# Patient Record
Sex: Female | Born: 1970 | Race: White | Hispanic: No | Marital: Married | Smoking: Former smoker
Health system: Southern US, Community
[De-identification: ages and names within clinical notes are randomized; demographics above are authoritative.]

## PROBLEM LIST (undated history)

## (undated) DIAGNOSIS — G43909 Migraine, unspecified, not intractable, without status migrainosus: Secondary | ICD-10-CM

## (undated) DIAGNOSIS — T7840XA Allergy, unspecified, initial encounter: Secondary | ICD-10-CM

## (undated) DIAGNOSIS — F339 Major depressive disorder, recurrent, unspecified: Secondary | ICD-10-CM

## (undated) DIAGNOSIS — R011 Cardiac murmur, unspecified: Secondary | ICD-10-CM

## (undated) DIAGNOSIS — F329 Major depressive disorder, single episode, unspecified: Secondary | ICD-10-CM

## (undated) DIAGNOSIS — F32A Depression, unspecified: Secondary | ICD-10-CM

## (undated) HISTORY — DX: Major depressive disorder, single episode, unspecified: F32.9

## (undated) HISTORY — DX: Allergy, unspecified, initial encounter: T78.40XA

## (undated) HISTORY — DX: Depression, unspecified: F32.A

## (undated) HISTORY — DX: Cardiac murmur, unspecified: R01.1

## (undated) HISTORY — DX: Migraine, unspecified, not intractable, without status migrainosus: G43.909

## (undated) HISTORY — DX: Major depressive disorder, recurrent, unspecified: F33.9

---

## 2018-06-02 ENCOUNTER — Telehealth: Payer: Self-pay

## 2018-06-02 NOTE — Telephone Encounter (Signed)
FYI  Copied from CRM [id number]. Topic: General - Other >> Jun 02, 2018 10:32 AM Raquel SarnaHayes, Teresa G wrote: New pt appt w/ Mardelle MatteAndy on 06-15-18.

## 2018-06-15 ENCOUNTER — Encounter: Payer: Self-pay | Admitting: Family Medicine

## 2018-06-15 ENCOUNTER — Other Ambulatory Visit: Payer: Self-pay

## 2018-06-15 ENCOUNTER — Ambulatory Visit (INDEPENDENT_AMBULATORY_CARE_PROVIDER_SITE_OTHER): Payer: BLUE CROSS/BLUE SHIELD | Admitting: Family Medicine

## 2018-06-15 VITALS — BP 110/64 | HR 53 | Temp 98.7°F | Ht 62.0 in | Wt 145.4 lb

## 2018-06-15 DIAGNOSIS — G43009 Migraine without aura, not intractable, without status migrainosus: Secondary | ICD-10-CM | POA: Insufficient documentation

## 2018-06-15 DIAGNOSIS — Z136 Encounter for screening for cardiovascular disorders: Secondary | ICD-10-CM | POA: Diagnosis not present

## 2018-06-15 DIAGNOSIS — G5603 Carpal tunnel syndrome, bilateral upper limbs: Secondary | ICD-10-CM

## 2018-06-15 DIAGNOSIS — M778 Other enthesopathies, not elsewhere classified: Secondary | ICD-10-CM

## 2018-06-15 DIAGNOSIS — Z8669 Personal history of other diseases of the nervous system and sense organs: Secondary | ICD-10-CM | POA: Diagnosis not present

## 2018-06-15 DIAGNOSIS — F339 Major depressive disorder, recurrent, unspecified: Secondary | ICD-10-CM | POA: Diagnosis not present

## 2018-06-15 DIAGNOSIS — M779 Enthesopathy, unspecified: Secondary | ICD-10-CM | POA: Diagnosis not present

## 2018-06-15 DIAGNOSIS — Z23 Encounter for immunization: Secondary | ICD-10-CM | POA: Diagnosis not present

## 2018-06-15 HISTORY — DX: Major depressive disorder, recurrent, unspecified: F33.9

## 2018-06-15 LAB — COMPREHENSIVE METABOLIC PANEL
ALT: 18 U/L (ref 0–35)
AST: 22 U/L (ref 0–37)
Albumin: 4.5 g/dL (ref 3.5–5.2)
Alkaline Phosphatase: 46 U/L (ref 39–117)
BUN: 19 mg/dL (ref 6–23)
CALCIUM: 9.3 mg/dL (ref 8.4–10.5)
CHLORIDE: 102 meq/L (ref 96–112)
CO2: 30 meq/L (ref 19–32)
Creatinine, Ser: 0.74 mg/dL (ref 0.40–1.20)
GFR: 90.24 mL/min (ref >60.00)
GLUCOSE: 100 mg/dL — AB (ref 70–99)
POTASSIUM: 3.9 meq/L (ref 3.5–5.1)
Sodium: 139 mEq/L (ref 135–145)
Total Bilirubin: 0.4 mg/dL (ref 0.2–1.2)
Total Protein: 6.8 g/dL (ref 6.0–8.3)

## 2018-06-15 LAB — CBC WITH DIFFERENTIAL/PLATELET
Basophils Absolute: 0 10*3/uL (ref 0.0–0.1)
Basophils Relative: 0.7 % (ref 0.0–3.0)
EOS PCT: 2.5 % (ref 0.0–5.0)
Eosinophils Absolute: 0.1 10*3/uL (ref 0.0–0.7)
HCT: 40 % (ref 36.0–46.0)
Hemoglobin: 13.8 g/dL (ref 12.0–15.0)
LYMPHS ABS: 2 10*3/uL (ref 0.7–4.0)
Lymphocytes Relative: 36.5 % (ref 12.0–46.0)
MCHC: 34.4 g/dL (ref 30.0–36.0)
MCV: 93 fl (ref 78.0–100.0)
MONO ABS: 0.3 10*3/uL (ref 0.1–1.0)
MONOS PCT: 6 % (ref 3.0–12.0)
NEUTROS ABS: 2.9 10*3/uL (ref 1.4–7.7)
NEUTROS PCT: 54.3 % (ref 43.0–77.0)
PLATELETS: 228 10*3/uL (ref 150.0–400.0)
RBC: 4.3 Mil/uL (ref 3.87–5.11)
RDW: 12.7 % (ref 11.5–15.5)
WBC: 5.4 10*3/uL (ref 4.0–10.5)

## 2018-06-15 LAB — LIPID PANEL
CHOL/HDL RATIO: 3
Cholesterol: 173 mg/dL (ref 0–200)
HDL: 60.1 mg/dL (ref >39.00)
LDL Cholesterol: 90 mg/dL (ref 0–99)
NonHDL: 112.54
TRIGLYCERIDES: 114 mg/dL (ref 0.0–149.0)
VLDL: 22.8 mg/dL (ref 0.0–40.0)

## 2018-06-15 LAB — TSH: TSH: 1.38 u[IU]/mL (ref 0.35–4.50)

## 2018-06-15 MED ORDER — DICLOFENAC SODIUM 75 MG PO TBEC: 75.0000 mg | DELAYED_RELEASE_TABLET | Freq: Two times a day (BID) | ORAL | 0 refills | Status: DC

## 2018-06-15 NOTE — Patient Instructions (Signed)
Please return in 2-4 weeks if your hand pain symptoms persist.  Start wearing carpal tunnel splints as much as possible.  Take the diclofenac twice a day for 7-14 days with meals.   It was a pleasure meeting you today! Thank you for choosing [place] to meet your healthcare needs! I truly look forward to working with you. If you have any questions or concerns, please send me a message via Mychart or call the office at [phone].   Carpal Tunnel Syndrome Carpal tunnel syndrome is a condition that causes pain in your hand and arm. The carpal tunnel is a narrow area located on the palm side of your wrist. Repeated wrist motion or certain diseases may cause swelling within the tunnel. This swelling pinches the main nerve in the wrist (median nerve). What are the causes? This condition may be caused by:  Repeated wrist motions.  Wrist injuries.  Arthritis.  A cyst or tumor in the carpal tunnel.  Fluid buildup during pregnancy.  Sometimes the cause of this condition is not known. What increases the risk? This condition is more likely to develop in:  People who have jobs that cause them to repeatedly move their wrists in the same motion, such as Health visitor.  Women.  People with certain conditions, such as: ? Diabetes. ? Obesity. ? An underactive thyroid (hypothyroidism). ? Kidney failure.  What are the signs or symptoms? Symptoms of this condition include:  A tingling feeling in your fingers, especially in your thumb, index, and middle fingers.  Tingling or numbness in your hand.  An aching feeling in your entire arm, especially when your wrist and elbow are bent for long periods of time.  Wrist pain that goes up your arm to your shoulder.  Pain that goes down into your palm or fingers.  A weak feeling in your hands. You may have trouble grabbing and holding items.  Your symptoms may feel worse during the night. How is this diagnosed? This condition is diagnosed  with a medical history and physical exam. You may also have tests, including:  An electromyogram (EMG). This test measures electrical signals sent by your nerves into the muscles.  X-rays.  How is this treated? Treatment for this condition includes:  Lifestyle changes. It is important to stop doing or modify the activity that caused your condition.  Physical or occupational therapy.  Medicines for pain and inflammation. This may include medicine that is injected into your wrist.  A wrist splint.  Surgery.  Follow these instructions at home: If you have a splint:  Wear it as told by your health care provider. Remove it only as told by your health care provider.  Loosen the splint if your fingers become numb and tingle, or if they turn cold and blue.  Keep the splint clean and dry. General instructions  Take over-the-counter and prescription medicines only as told by your health care provider.  Rest your wrist from any activity that may be causing your pain. If your condition is work related, talk to your employer about changes that can be made, such as getting a wrist pad to use while typing.  If directed, apply ice to the painful area: ? Put ice in a plastic bag. ? Place a towel between your skin and the bag. ? Leave the ice on for 20 minutes, 2-3 times per day.  Keep all follow-up visits as told by your health care provider. This is important.  Do any exercises as told by  your health care provider, physical therapist, or occupational therapist. Contact a health care provider if:  You have new symptoms.  Your pain is not controlled with medicines.  Your symptoms get worse. This information is not intended to replace advice given to you by your health care provider. Make sure you discuss any questions you have with your health care provider. Document Released: 09/04/2000 Document Revised: 01/16/2016 Document Reviewed: 01/23/2015 Elsevier Interactive Patient Education   [employer].

## 2018-06-15 NOTE — Progress Notes (Signed)
Subjective  CC:  Chief Complaint  Patient presents with  . Establish Care    Transfer from [place], [place], Last seen at [facility], last physical 01/2018, requests flu shot   . Hand Pain    Bi-lateral Hand Pain, hands fall asleep periodically     HPI: Tiffany Knox is a 47 y.o. female who presents to [facility] at [facility] today to establish care with me as a new patient.  Very pleasant 34 year old married mother of 2 children who recently moved here from [place] due to her husband's job.  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  Living healthy lifestyle with healthy diet and frequent exercise.  Recent physicals are up-to-date.  Mammogram and Pap smears were normal this year.  Believes her tetanus shot is up-to-date as well.  Needle records to verify. She has the following concerns or needs:  Reports 2-year history of carpal tunnel symptoms that are not progressive.  Reports daily pain, paresthesias at night.  Also with left greater than right dorsal hand pain.  Hurts to grip.  Admits has been packing and moving and recently painting her new home.  No trauma.  She exercises frequently.  Uses heavy weights.  No elbow pain.  Distant history of rheumatoid arthritis in grandparent.  No red hot swollen joints.  She has not taken any medications for pain.  She did have significant carpal tunnel treated with splints during her pregnancy 8 years ago.  Past medical history significant for childhood migraines, chronic recurrent depression is well controlled on Lexapro  Assessment  1. Bilateral carpal tunnel syndrome   2. History of migraine   3. Major depression, recurrent, chronic (HCC)   4. Hand tendonitis   5. Screening for cardiovascular condition      Plan   Bilateral carpal tunnel: Education treatment management strategies discussed.  See AVS.  Start diclofenac and wrist splints.  Return if not improved  Hand tendinitis plus minus mild  osteoarthritis: NSAIDs as needed.  Check thyroid and other labs for carpal tunnel and cardiovascular screening test.  Continue treatment for major depression.  Flu shot updated today.  Health maintenance visits due in May.  Follow up:  Return in about 8 months (around 02/13/2019) for complete physical. Orders Placed This Encounter  Procedures  . Flu Vaccine QUAD 6+ mos PF IM (Fluarix Quad PF)  . CBC with Differential/Platelet  . TSH  . Comprehensive metabolic panel  . Lipid panel   Meds ordered this encounter  Medications  . diclofenac (VOLTAREN) 75 MG EC tablet    Sig: Take 1 tablet (75 mg total) by mouth 2 (two) times daily.    Dispense:  30 tablet    Refill:  0     No flowsheet data found.  We updated and reviewed the patient's past history in detail and it is documented below.  Patient Active Problem List   Diagnosis Date Noted  . History of migraine 06/15/2018  . 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 Well controlled.    Health Maintenance  Topic Date Due  . HIV Screening  07/22/1988  . TETANUS/TDAP  07/22/1992  . INFLUENZA VACCINE  04/21/2018  . MAMMOGRAM  01/20/2020  . PAP SMEAR  11/19/2020   There is no immunization history for the selected administration types on file for this patient. Current Meds  Medication Sig  .  escitalopram (LEXAPRO) 20 MG tablet Take 20 mg by mouth daily.    Allergies: Patient has No Known Allergies. Past Medical History Patient  has a past medical history of Allergy, Depression, Heart murmur, Major depression, recurrent, chronic (HCC) (06/15/2018), and Migraines. Past Surgical History Patient  has no past surgical history on file. Family History: Patient family history includes Bladder Cancer in her mother; Breast cancer in her paternal grandmother; Healthy in her daughter. Social History:  Patient  reports that  she quit smoking about 15 years ago. She has a 15.00 pack-year smoking history. She has never used smokeless tobacco. She reports that she drinks alcohol. She reports that she does not use drugs.  Review of Systems: Constitutional: negative for fever or malaise Ophthalmic: negative for photophobia, double vision or loss of vision Cardiovascular: negative for chest pain, dyspnea on exertion, or new LE swelling Respiratory: negative for SOB or persistent cough Gastrointestinal: negative for abdominal pain, change in bowel habits or melena Genitourinary: negative for dysuria or gross hematuria Musculoskeletal: negative for new gait disturbance or muscular weakness Integumentary: negative for new or persistent rashes Neurological: negative for TIA or stroke symptoms Psychiatric: negative for SI or delusions Allergic/Immunologic: negative for hives  Patient Care Team    Relationship Specialty Notifications Start End  Willow Ora, MD PCP - General Family Medicine  06/15/18     Objective  Vitals: BP 110/64   Pulse (!) 53   Temp 98.7 F (37.1 C)   Ht 5\' 2"  (1.575 m)   Wt 145 lb 6.4 oz (66 kg)   SpO2 98%   BMI 26.59 kg/m  General:  Well developed, well nourished, no acute distress, felt appearing Psych:  Alert and oriented,normal mood and affect HEENT:  Normocephalic, atraumatic, non-icteric sclera, PERRL, oropharynx is without mass or exudate, supple neck without adenopathy, mass or thyromegaly Cardiovascular:  RRR without gallop, rub or murmur, nondisplaced PMI Respiratory:  Good breath sounds bilaterally, CTAB with normal respiratory effort  MSK: no deformities, contusions. Joints are without erythema or swelling, nontender joints, dorsal hand tendons mildly tender without redness or warmth.  Normal strength throughout.  Positive Phalen's bilaterally, no weakness or atrophy Skin:  Warm, no rashes or suspicious lesions noted Neurologic:    Mental status is normal. Gross motor and  sensory exams are normal. Normal gait   Commons side effects, risks, benefits, and alternatives for medications and treatment plan prescribed today were discussed, and the patient expressed understanding of the given instructions. Patient is instructed to call or message via MyChart if he/she has any questions or concerns regarding our treatment plan. No barriers to understanding were identified. We discussed Red Flag symptoms and signs in detail. Patient expressed understanding regarding what to do in case of urgent or emergency type symptoms.   Medication list was reconciled, printed and provided to the patient in AVS. Patient instructions and summary information was reviewed with the patient as documented in the AVS. This note was prepared with assistance of Dragon voice recognition software. Occasional wrong-word or sound-a-like substitutions may have occurred due to the inherent limitations of voice recognition software

## 2018-06-16 ENCOUNTER — Encounter: Payer: Self-pay | Admitting: Family Medicine

## 2018-06-16 NOTE — Progress Notes (Signed)
I have reviewed results. Normal. Patient notified by letter. Please see letter for details. 

## 2018-06-16 NOTE — Progress Notes (Signed)
Lab results mailed to patient in letter. Normal results. No action / follow up needed on these results.  

## 2018-08-29 ENCOUNTER — Encounter: Payer: Self-pay | Admitting: Family Medicine

## 2018-08-29 ENCOUNTER — Ambulatory Visit (INDEPENDENT_AMBULATORY_CARE_PROVIDER_SITE_OTHER): Payer: BLUE CROSS/BLUE SHIELD | Admitting: Family Medicine

## 2018-08-29 ENCOUNTER — Other Ambulatory Visit: Payer: Self-pay

## 2018-08-29 VITALS — BP 110/62 | HR 72 | Temp 98.0°F | Ht 62.0 in | Wt 145.0 lb

## 2018-08-29 DIAGNOSIS — R21 Rash and other nonspecific skin eruption: Secondary | ICD-10-CM

## 2018-08-29 MED ORDER — BETAMETHASONE DIPROPIONATE 0.05 % EX CREA: TOPICAL_CREAM | Freq: Two times a day (BID) | CUTANEOUS | 0 refills | Status: DC

## 2018-08-29 NOTE — Progress Notes (Signed)
Subjective  CC:  Chief Complaint  Patient presents with  . Rash    rash on stomach and arms x 1 week, red and itchy     HPI: Tiffany Knox is a 47 y.o. female who presents to the office today to address the problems listed above in the chief complaint.  47 year old healthy female here with rash as noted above.  Started on lower abdomen.  Describes red itching rash with small bumps now on left wrist and bilateral axilla.  She reports that her son has a red macular rash on the dorsum of his hands.  Her daughter has a papular rash.  She denies recent travel.  She does have URI but this started just a few days ago.  She has not used any topical medications.  No history of nickel dermatitis or allergic dermatitis.  No associated symptoms. Assessment  1. Rash and nonspecific skin eruption      Plan   Rash: Contact dermatitis versus allergic dermatitis versus scabies versus others.  Unclear.  Rash was atypical.  Recommend steroid cream twice daily and follow-up if spreading.  Consider treatment for scabies.  No allergens identified  Follow up: Return in about 5 months (around 01/28/2019) for complete physical.   No orders of the defined types were placed in this encounter.  Meds ordered this encounter  Medications  . betamethasone dipropionate (DIPROLENE) 0.05 % cream    Sig: Apply topically 2 (two) times daily.    Dispense:  45 g    Refill:  0      I reviewed the patients updated PMH, FH, and SocHx.    Patient Active Problem List   Diagnosis Date Noted  . History of migraine 06/15/2018  . Major depression, recurrent, chronic (HCC) 06/15/2018   Current Meds  Medication Sig  . escitalopram (LEXAPRO) 20 MG tablet Take 20 mg by mouth daily.    Allergies: Patient has No Known Allergies. Family History: Patient family history includes Arthritis in her paternal grandmother; Asthma in her paternal grandmother, sister, and sister; Bladder Cancer in her mother; Breast cancer in her  paternal grandmother; COPD in her father; Early death in her paternal grandfather; Healthy in her daughter; Hearing loss in her father; Heart attack in her paternal grandfather; Hyperlipidemia in her maternal grandmother; Hypertension in her father and maternal grandmother; Kidney disease in her maternal grandfather; Lung cancer in her maternal grandfather; Miscarriages / [place] in her mother; Stroke in her mother. Social History:  Patient  reports that she quit smoking about 15 years ago. She has a 15.00 pack-year smoking history. She has never used smokeless tobacco. She reports that she drinks alcohol. She reports that she does not use drugs.  Review of Systems: Constitutional: Negative for fever malaise or anorexia Cardiovascular: negative for chest pain Respiratory: negative for SOB or persistent cough Gastrointestinal: negative for abdominal pain  Objective  Vitals: BP 110/62   Pulse 72   Temp 98 F (36.7 C)   Ht 5\' 2"  (1.575 m)   Wt 145 lb (65.8 kg)   SpO2 98%   BMI 26.52 kg/m  General: no acute distress , A&Ox3 HEENT: PEERL, conjunctiva normal, nasal congestion present oropharynx moist,neck is supple Skin:  Warm, lower abdomen with irregularly-shaped macular rash with satellite lesions and excoriation with bruising, fine papular rash on bilateral axilla, question burrow at left volar wrist.  No urticaria, no vesicles, no flaking.  No annular lesions.    Commons side effects, risks, benefits, and alternatives for  medications and treatment plan prescribed today were discussed, and the patient expressed understanding of the given instructions. Patient is instructed to call or message via MyChart if he/she has any questions or concerns regarding our treatment plan. No barriers to understanding were identified. We discussed Red Flag symptoms and signs in detail. Patient expressed understanding regarding what to do in case of urgent or emergency type symptoms.   Medication list was  reconciled, printed and provided to the patient in AVS. Patient instructions and summary information was reviewed with the patient as documented in the AVS. This note was prepared with assistance of Dragon voice recognition software. Occasional wrong-word or sound-a-like substitutions may have occurred due to the inherent limitations of voice recognition software

## 2018-08-29 NOTE — Patient Instructions (Addendum)
Please return in May 2020 for your annual complete physical; please come fasting.  Use the cream twice daily x 1-2 weeks.  Start zyrtec nightly for itching; you may add benadryl or benadryl cream as well.  Let me know if it is spreading.   If you have any questions or concerns, please don't hesitate to send me a message via MyChart or call the office at [phone]. Thank you for visiting with Tiffany Knox today! It's our pleasure caring for you.

## 2018-11-16 ENCOUNTER — Ambulatory Visit (INDEPENDENT_AMBULATORY_CARE_PROVIDER_SITE_OTHER): Payer: BLUE CROSS/BLUE SHIELD | Admitting: Family Medicine

## 2018-11-16 ENCOUNTER — Other Ambulatory Visit: Payer: Self-pay

## 2018-11-16 ENCOUNTER — Encounter: Payer: Self-pay | Admitting: Family Medicine

## 2018-11-16 VITALS — BP 98/58 | HR 54 | Temp 99.4°F | Resp 14 | Ht 62.0 in | Wt 135.8 lb

## 2018-11-16 DIAGNOSIS — F339 Major depressive disorder, recurrent, unspecified: Secondary | ICD-10-CM | POA: Diagnosis not present

## 2018-11-16 MED ORDER — ESCITALOPRAM OXALATE 20 MG PO TABS: 20.0000 mg | ORAL_TABLET | Freq: Every day | ORAL | 3 refills | Status: DC

## 2018-11-16 NOTE — Progress Notes (Signed)
Subjective  CC:  Chief Complaint  Patient presents with  . Depression    Medication refill    HPI: Tiffany Knox is a 48 y.o. female who presents to the office today to address the problems listed above in the chief complaint. Tiffany Knox 48 year old female, very healthy and active with chronic depression that has been managed on Lexapro 20 mg for the last year or so.  Prior to this she was on sertraline.  Her prior PCP initiated change due to lessening efficacy of Zoloft.  Since, she reports she is doing very well.  She denies apathy or side effects.  Her mood is well controlled.  No symptoms of depression or anxiety at this time.   Assessment  1. Major depression, recurrent, chronic (HCC)      Plan   Depression: This medical condition is well controlled. There are no signs of complications, medication side effects, or red flags. Patient is instructed to continue the current treatment plan without change in therapies or medications.  Refill medication  Follow up: Return in about 3 months (around 02/14/2019) for complete physical.  Visit date not found  No orders of the defined types were placed in this encounter.  Meds ordered this encounter  Medications  . escitalopram (LEXAPRO) 20 MG tablet    Sig: Take 1 tablet (20 mg total) by mouth daily.    Dispense:  90 tablet    Refill:  3      I reviewed the patients updated PMH, FH, and SocHx.    Patient Active Problem List   Diagnosis Date Noted  . History of migraine 06/15/2018  . Major depression, recurrent, chronic (HCC) 06/15/2018   Current Meds  Medication Sig  . betamethasone dipropionate (DIPROLENE) 0.05 % cream Apply topically 2 (two) times daily.  [name] escitalopram (LEXAPRO) 20 MG tablet Take 1 tablet (20 mg total) by mouth daily.  . [DISCONTINUED] escitalopram (LEXAPRO) 20 MG tablet Take 20 mg by mouth daily.    Allergies: Patient has No Known Allergies. Family History: Patient family history includes Arthritis in her  paternal grandmother; Asthma in her paternal grandmother, sister, and sister; Bladder Cancer in her mother; Breast cancer in her paternal grandmother; COPD in her father; Early death in her paternal grandfather; Healthy in her daughter; Hearing loss in her father; Heart attack in her paternal grandfather; Hyperlipidemia in her maternal grandmother; Hypertension in her father and maternal grandmother; Kidney disease in her maternal grandfather; Lung cancer in her maternal grandfather; Miscarriages / [place] in her mother; Stroke in her mother. Social History:  Patient  reports that she quit smoking about 16 years ago. She has a 15.00 pack-year smoking history. She has never used smokeless tobacco. She reports current alcohol use. She reports that she does not use drugs.  Review of Systems: Constitutional: Negative for fever malaise or anorexia Cardiovascular: negative for chest pain Respiratory: negative for SOB or persistent cough Gastrointestinal: negative for abdominal pain  Objective  Vitals: BP (!) 98/58   Pulse (!) 54   Temp 99.4 F (37.4 C) (Oral)   Resp 14   Ht 5\' 2"  (1.575 m)   Wt 135 lb 12.8 oz (61.6 kg)   LMP 10/26/2018   SpO2 98%   BMI 24.84 kg/m  General: no acute distress , A&Ox3 Psych: Appears well.    Commons side effects, risks, benefits, and alternatives for medications and treatment plan prescribed today were discussed, and the patient expressed understanding of the given instructions. Patient is instructed to  call or message via MyChart if he/she has any questions or concerns regarding our treatment plan. No barriers to understanding were identified. We discussed Red Flag symptoms and signs in detail. Patient expressed understanding regarding what to do in case of urgent or emergency type symptoms.   Medication list was reconciled, printed and provided to the patient in AVS. Patient instructions and summary information was reviewed with the patient as documented in  the AVS. This note was prepared with assistance of Dragon voice recognition software. Occasional wrong-word or sound-a-like substitutions may have occurred due to the inherent limitations of voice recognition software

## 2018-11-16 NOTE — Patient Instructions (Signed)
Please return in May 2020 for your annual complete physical; please come fasting.   If you have any questions or concerns, please don't hesitate to send me a message via MyChart or call the office at [phone]. Thank you for visiting with [place] today! It's our pleasure caring for you.

## 2019-02-09 ENCOUNTER — Ambulatory Visit (INDEPENDENT_AMBULATORY_CARE_PROVIDER_SITE_OTHER): Payer: BLUE CROSS/BLUE SHIELD | Admitting: Family Medicine

## 2019-02-09 ENCOUNTER — Encounter: Payer: Self-pay | Admitting: Family Medicine

## 2019-02-09 ENCOUNTER — Other Ambulatory Visit: Payer: Self-pay

## 2019-02-09 VITALS — BP 98/62 | HR 52 | Temp 97.8°F | Resp 16 | Ht 62.0 in | Wt 132.2 lb

## 2019-02-09 DIAGNOSIS — Z0000 Encounter for general adult medical examination without abnormal findings: Secondary | ICD-10-CM

## 2019-02-09 DIAGNOSIS — Z1239 Encounter for other screening for malignant neoplasm of breast: Secondary | ICD-10-CM | POA: Diagnosis not present

## 2019-02-09 DIAGNOSIS — F339 Major depressive disorder, recurrent, unspecified: Secondary | ICD-10-CM

## 2019-02-09 LAB — CBC WITH DIFFERENTIAL/PLATELET
Basophils Absolute: 0 10*3/uL (ref 0.0–0.1)
Basophils Relative: 0.8 % (ref 0.0–3.0)
Eosinophils Absolute: 0.1 10*3/uL (ref 0.0–0.7)
Eosinophils Relative: 2.8 % (ref 0.0–5.0)
HCT: 39.2 % (ref 36.0–46.0)
Hemoglobin: 13.7 g/dL (ref 12.0–15.0)
Lymphocytes Relative: 32.3 % (ref 12.0–46.0)
Lymphs Abs: 1.7 10*3/uL (ref 0.7–4.0)
MCHC: 34.9 g/dL (ref 30.0–36.0)
MCV: 92.6 fl (ref 78.0–100.0)
Monocytes Absolute: 0.4 10*3/uL (ref 0.1–1.0)
Monocytes Relative: 7 % (ref 3.0–12.0)
Neutro Abs: 3 10*3/uL (ref 1.4–7.7)
Neutrophils Relative %: 57.1 % (ref 43.0–77.0)
Platelets: 221 10*3/uL (ref 150.0–400.0)
RBC: 4.23 Mil/uL (ref 3.87–5.11)
RDW: 13.1 % (ref 11.5–15.5)
WBC: 5.3 10*3/uL (ref 4.0–10.5)

## 2019-02-09 LAB — LIPID PANEL
Cholesterol: 177 mg/dL (ref 0–200)
HDL: 64.9 mg/dL (ref >39.00)
LDL Cholesterol: 100 mg/dL — ABNORMAL HIGH (ref 0–99)
NonHDL: 112.44
Total CHOL/HDL Ratio: 3
Triglycerides: 64 mg/dL (ref 0.0–149.0)
VLDL: 12.8 mg/dL (ref 0.0–40.0)

## 2019-02-09 LAB — COMPREHENSIVE METABOLIC PANEL
ALT: 21 U/L (ref 0–35)
AST: 25 U/L (ref 0–37)
Albumin: 4.4 g/dL (ref 3.5–5.2)
Alkaline Phosphatase: 34 U/L — ABNORMAL LOW (ref 39–117)
BUN: 23 mg/dL (ref 6–23)
CO2: 31 mEq/L (ref 19–32)
Calcium: 8.9 mg/dL (ref 8.4–10.5)
Chloride: 104 mEq/L (ref 96–112)
Creatinine, Ser: 0.88 mg/dL (ref 0.40–1.20)
GFR: 69.32 mL/min (ref >60.00)
Glucose, Bld: 80 mg/dL (ref 70–99)
Potassium: 3.6 mEq/L (ref 3.5–5.1)
Sodium: 142 mEq/L (ref 135–145)
Total Bilirubin: 0.5 mg/dL (ref 0.2–1.2)
Total Protein: 6.7 g/dL (ref 6.0–8.3)

## 2019-02-09 NOTE — Progress Notes (Signed)
Subjective  Chief Complaint  Patient presents with  . Annual Exam    She is fasting    HPI: Tiffany Knox is a 48 y.o. female who presents to [facility] [place] today for a Female Wellness Visit.   Wellness Visit: annual visit with health maintenance review and exam without Pap   Happy and healthy. No concerns. Mood is well controlled. Continues on meds. Healthy lifestyle. 48 yo and 46 yo at home and doing well. No birth control needs due to husband with vasectomy. Exercises regularly. Due mammogram. Pap is up to date.   Assessment  1. Annual physical exam   2. Major depression, recurrent, chronic (HCC)   3. Breast cancer screening      Plan  Female Wellness Visit:  Age appropriate Health Maintenance and Prevention measures were discussed with patient. Included topics are cancer screening recommendations, ways to keep healthy (see AVS) including dietary and exercise recommendations, regular eye and dental care, use of seat belts, and avoidance of moderate alcohol use and tobacco use. Referred for mammogram.   BMI: discussed patient's BMI and encouraged positive lifestyle modifications to help get to or maintain a target BMI.  HM needs and immunizations were addressed and ordered. See below for orders. See HM and immunization section for updates.  Routine labs and screening tests ordered including cmp, cbc and lipids where appropriate.  Discussed recommendations regarding Vit D and calcium supplementation (see AVS)  Continue antidepressant.   Follow up: Return in about 1 year (around 02/09/2020) for complete physical.   Orders Placed This Encounter  Procedures  . MM DIGITAL SCREENING BILATERAL  . CBC with Differential/Platelet  . Comprehensive metabolic panel  . Lipid panel  . HIV Antibody (routine testing w rflx)   No orders of the defined types were placed in this encounter.    Lifestyle: Body mass index is 24.18 kg/m. Wt Readings from Last 3  Encounters:  02/09/19 132 lb 3.2 oz (60 kg)  11/16/18 135 lb 12.8 oz (61.6 kg)  08/29/18 145 lb (65.8 kg)   Diet: low fat Exercise: frequently,   Patient Active Problem List   Diagnosis Date Noted  . History of migraine 06/15/2018  . 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 Well controlled.    Health Maintenance  Topic Date Due  . HIV Screening  07/22/1988  . MAMMOGRAM  01/20/2019  . INFLUENZA VACCINE  04/22/2019  . TETANUS/TDAP  07/26/2019  . PAP SMEAR-Modifier  11/19/2020   Immunization History  Administered Date(s) Administered  . Hepatitis A 12/30/2015, 07/02/2016  . Influenza,inj,Quad PF,6+ Mos 06/15/2018  . Td 08/05/2004  . Tdap 07/25/2009   We updated and reviewed the patient's past history in detail and it is documented below. Allergies: Patient has No Known Allergies. Past Medical History Patient  has a past medical history of Allergy, Depression, Heart murmur, Major depression, recurrent, chronic (HCC) (06/15/2018), and Migraines. Past Surgical History Patient  has no past surgical history on file. Family History: Patient family history includes Arthritis in her paternal grandmother; Asthma in her paternal grandmother, sister, and sister; Bladder Cancer in her mother; Breast cancer in her paternal grandmother; COPD in her father; Early death in her paternal grandfather; Healthy in her daughter; Hearing loss in her father; Heart attack in her paternal grandfather; Hyperlipidemia in her maternal grandmother; Hypertension in her father and maternal grandmother; Kidney  disease in her maternal grandfather; Lung cancer in her maternal grandfather; Miscarriages / [place] in her mother; Stroke in her mother. Social History:  Patient  reports that she quit smoking about 16 years ago. She has a 15.00 pack-year smoking history. She has never used smokeless  tobacco. She reports current alcohol use. She reports that she does not use drugs.  Review of Systems: Constitutional: negative for fever or malaise Ophthalmic: negative for photophobia, double vision or loss of vision Cardiovascular: negative for chest pain, dyspnea on exertion, or new LE swelling Respiratory: negative for SOB or persistent cough Gastrointestinal: negative for abdominal pain, change in bowel habits or melena Genitourinary: negative for dysuria or gross hematuria, no abnormal uterine bleeding or disharge Musculoskeletal: negative for new gait disturbance or muscular weakness Integumentary: negative for new or persistent rashes, no breast lumps Neurological: negative for TIA or stroke symptoms Psychiatric: negative for SI or delusions Allergic/Immunologic: negative for hives  Patient Care Team    Relationship Specialty Notifications Start End  Willow OraAndy,  L, MD PCP - General Family Medicine  06/15/18     Objective  Vitals: BP 98/62   Pulse (!) 52   Temp 97.8 F (36.6 C) (Oral)   Resp 16   Ht 5\' 2"  (1.575 m)   Wt 132 lb 3.2 oz (60 kg)   LMP 02/01/2019   SpO2 98%   BMI 24.18 kg/m  General:  Well developed, well nourished, no acute distress  Psych:  Alert and orientedx3,normal mood and affect HEENT:  Normocephalic, atraumatic, non-icteric sclera, PERRL, oropharynx is clear without mass or exudate, supple neck without adenopathy, mass or thyromegaly Cardiovascular:  Normal S1, S2, RRR without gallop, rub or murmur, nondisplaced PMI Respiratory:  Good breath sounds bilaterally, CTAB with normal respiratory effort Gastrointestinal: normal bowel sounds, soft, non-tender, no noted masses. No HSM MSK: no deformities, contusions. Joints are without erythema or swelling. Spine and CVA region are nontender Skin:  Warm, no rashes or suspicious lesions noted Neurologic:    Mental status is normal. CN 2-11 are normal. Gross motor and sensory exams are normal. Normal gait. No  tremor Breast Exam: No mass, skin retraction or nipple discharge is appreciated in either breast. No axillary adenopathy. Fibrocystic changes are not noted   Commons side effects, risks, benefits, and alternatives for medications and treatment plan prescribed today were discussed, and the patient expressed understanding of the given instructions. Patient is instructed to call or message via MyChart if he/she has any questions or concerns regarding our treatment plan. No barriers to understanding were identified. We discussed Red Flag symptoms and signs in detail. Patient expressed understanding regarding what to do in case of urgent or emergency type symptoms.   Medication list was reconciled, printed and provided to the patient in AVS. Patient instructions and summary information was reviewed with the patient as documented in the AVS. This note was prepared with assistance of Dragon voice recognition software. Occasional wrong-word or sound-a-like substitutions may have occurred due to the inherent limitations of voice recognition software

## 2019-02-09 NOTE — Patient Instructions (Addendum)
Please return in 12 months for your annual complete physical; please come fasting.  Please sign up for Mychart. A test with a link was sent to your phone.  I will release your lab results to you on your MyChart account with further instructions. Please reply with any questions.   If you have any questions or concerns, please don't hesitate to send me a message via MyChart or call the office at [phone]. Thank you for visiting with [place] today! It's our pleasure caring for you.  Please do these things to maintain good health!   Exercise at least 30-45 minutes a day,  4-5 days a week.   Eat a low-fat diet with lots of fruits and vegetables, up to 7-9 servings per day.  Drink plenty of water daily. Try to drink 8 8oz glasses per day.  Seatbelts can save your life. Always wear your seatbelt.  Place Smoke Detectors on every level of your home and check batteries every year.  Schedule an appointment with an eye doctor for an eye exam every 1-2 years  Safe sex - use condoms to protect yourself from STDs if you could be exposed to these types of infections. Use birth control if you do not want to become pregnant and are sexually active.  Avoid heavy alcohol use. If you drink, keep it to less than 2 drinks/day and not every day.  Health Care Power of Attorney.  Choose someone you trust that could speak for you if you became unable to speak for yourself.  Depression is common in our stressful world.If you're feeling down or losing interest in things you normally enjoy, please come in for a visit.  If anyone is threatening or hurting you, please get help. Physical or Emotional Violence is never OK.

## 2019-02-10 ENCOUNTER — Encounter: Payer: Self-pay | Admitting: Family Medicine

## 2019-02-10 LAB — HIV ANTIBODY (ROUTINE TESTING W REFLEX): HIV 1&2 Ab, 4th Generation: NONREACTIVE

## 2019-02-10 NOTE — Progress Notes (Signed)
Lab results mailed to patient in letter. Normal results. No action / follow up needed on these results.  

## 2019-08-24 ENCOUNTER — Other Ambulatory Visit: Payer: Self-pay

## 2019-08-24 DIAGNOSIS — Z20822 Contact with and (suspected) exposure to covid-19: Secondary | ICD-10-CM

## 2019-08-26 LAB — NOVEL CORONAVIRUS, NAA: SARS-CoV-2, NAA: NOT DETECTED

## 2019-11-24 ENCOUNTER — Other Ambulatory Visit: Payer: Self-pay | Admitting: Family Medicine

## 2019-12-14 ENCOUNTER — Ambulatory Visit: Payer: Self-pay | Attending: Family

## 2019-12-14 DIAGNOSIS — Z23 Encounter for immunization: Secondary | ICD-10-CM

## 2019-12-14 NOTE — Progress Notes (Signed)
   Covid-19 Vaccination Clinic  Name:  Tiffany Knox    MRN: [id number] DOB: [birth year]  12/14/2019  Tiffany Knox was observed post Covid-19 immunization for 15 minutes without incident. She was provided with Vaccine Information Sheet and instruction to access the V-Safe system.   Tiffany Knox was instructed to call 911 with any severe reactions post vaccine: [name] Difficulty breathing  . Swelling of face and throat  . A fast heartbeat  . A bad rash all over body  . Dizziness and weakness   Immunizations Administered    Name Date Dose VIS Date Route   Moderna COVID-19 Vaccine 12/14/2019 11:39 AM 0.5 mL 08/22/2019 Intramuscular   Manufacturer: Moderna   Lot: [id number]   NDC: 82505-397-67

## 2020-01-16 ENCOUNTER — Ambulatory Visit: Payer: Self-pay | Attending: Family

## 2020-01-16 DIAGNOSIS — Z23 Encounter for immunization: Secondary | ICD-10-CM

## 2020-01-16 NOTE — Progress Notes (Signed)
   Covid-19 Vaccination Clinic  Name:  Tiffany Knox    MRN: [id number] DOB: 06/30/1971  01/16/2020  [name] was observed post Covid-19 immunization for 15 minutes without incident. She was provided with Vaccine Information Sheet and instruction to access the V-Safe system.   [name] was instructed to call 911 with any severe reactions post vaccine: [name] Difficulty breathing  . Swelling of face and throat  . A fast heartbeat  . A bad rash all over body  . Dizziness and weakness   Immunizations Administered    Name Date Dose VIS Date Route   Moderna COVID-19 Vaccine 01/16/2020 10:30 AM 0.5 mL 08/2019 Intramuscular   Manufacturer: Moderna   Lot: [id number]   NDC: 92763-943-20

## 2020-02-15 ENCOUNTER — Encounter: Payer: BC Managed Care – PPO | Admitting: Family Medicine

## 2020-02-16 ENCOUNTER — Encounter: Payer: Self-pay | Admitting: Family Medicine

## 2020-02-16 ENCOUNTER — Ambulatory Visit (INDEPENDENT_AMBULATORY_CARE_PROVIDER_SITE_OTHER): Payer: BC Managed Care – PPO | Admitting: Family Medicine

## 2020-02-16 ENCOUNTER — Other Ambulatory Visit: Payer: Self-pay

## 2020-02-16 VITALS — BP 118/74 | HR 61 | Temp 97.7°F | Resp 18 | Ht 62.0 in | Wt 136.6 lb

## 2020-02-16 DIAGNOSIS — F339 Major depressive disorder, recurrent, unspecified: Secondary | ICD-10-CM | POA: Diagnosis not present

## 2020-02-16 DIAGNOSIS — Z0000 Encounter for general adult medical examination without abnormal findings: Secondary | ICD-10-CM | POA: Diagnosis not present

## 2020-02-16 DIAGNOSIS — M5116 Intervertebral disc disorders with radiculopathy, lumbar region: Secondary | ICD-10-CM | POA: Diagnosis not present

## 2020-02-16 MED ORDER — TRAMADOL HCL 50 MG PO TABS: 50.0000 mg | ORAL_TABLET | Freq: Three times a day (TID) | ORAL | 0 refills | Status: AC | PRN

## 2020-02-16 MED ORDER — ESCITALOPRAM OXALATE 20 MG PO TABS: 10.0000 mg | ORAL_TABLET | Freq: Every day | ORAL | Status: DC

## 2020-02-16 MED ORDER — PREDNISONE 10 MG PO TABS
ORAL_TABLET | ORAL | 0 refills | Status: DC
Start: 2020-02-16 — End: 2021-06-23

## 2020-02-16 NOTE — Progress Notes (Signed)
Subjective  Chief Complaint  Patient presents with  . Back Pain    Back pain that has been radiating down her right leg into her foot. The back pain came from working out.   . Annual Exam    Will schedule a mammogram when shes done moving. She would like to get her tdap at her next office visit.     HPI: Tiffany Knox is a 49 y.o. female who presents to [place] at [place] today for a Female Wellness Visit. She also has the concerns and/or needs as listed above in the chief complaint. These will be addressed in addition to the Health Maintenance Visit.   Wellness Visit: annual visit with health maintenance review and exam without Pap   HM: overdue mammogram. Pap up to date. She is moving to [facility] next week. Will get established there and have mammo done. Declines tdap today. Chronic disease f/u and/or acute problem visit: (deemed necessary to be done in addition to the wellness visit):  Was exercising 2 weeks ago: crossFit and was twisting to the right with dumbbells in hands: felt acute sharp pain that radiated down right leg. Since has had daily pain with radiation down leg with some numbness. Taking 800 advil and tylenol w/ minimal relief. However, is sleep well. Ok lying down. Hasn't been able to exercise since the acute injury. Also is packing up house for move across country next week. No B/B dysfunction. No leg weakness. No h/o back problems.   Depression: now doing well on lexapro 10 daily. Has been well controlled for several years.  Assessment  1. Annual physical exam   2. Lumbar disc herniation with radiculopathy   3. Major depression, recurrent, chronic [facility])      Plan  Female Wellness Visit:  Age appropriate Health Maintenance and Prevention measures were discussed with patient. Included topics are cancer screening recommendations, ways to keep healthy (see AVS) including dietary and exercise recommendations, regular eye and dental care, use of  seat belts, and avoidance of moderate alcohol use and tobacco use. rec mammogram in [place] soon.   BMI: discussed patient's BMI and encouraged positive lifestyle modifications to help get to or maintain a target BMI.  HM needs and immunizations were addressed and ordered. See below for orders. See HM and immunization section for updates. tdap when able  Routine labs and screening tests ordered including cmp, cbc and lipids where appropriate.  Discussed recommendations regarding Vit D and calcium supplementation (see AVS)  Chronic disease management visit and/or acute problem visit:  Depression is well controlled.   Lumbar herniated disk with radiculopathy: mild to moderate. Start pred and tramadol. Stretches. Avoid further strain/heavy lifting. Discussed red flag sxs. F/u with me next week if not improving.   Follow up: Return if symptoms worsen or fail to improve.  Orders Placed This Encounter  Procedures  . TSH  . Lipid panel  . Comprehensive metabolic panel  . CBC with Differential/Platelet   Meds ordered this encounter  Medications  . predniSONE (DELTASONE) 10 MG tablet    Sig: Take 4 tabs qd x 2 days, 3 qd x 2 days, 2 qd x 2d, 1qd x 3 days    Dispense:  21 tablet    Refill:  0  . traMADol (ULTRAM) 50 MG tablet    Sig: Take 1 tablet (50 mg total) by mouth every 8 (eight) hours as needed for up to 5 days.    Dispense:  15 tablet  Refill:  0  . escitalopram (LEXAPRO) 20 MG tablet    Sig: Take 0.5 tablets (10 mg total) by mouth daily.      Lifestyle: Body mass index is 24.98 kg/m. Wt Readings from Last 3 Encounters:  02/16/20 136 lb 9.6 oz (62 kg)  02/09/19 132 lb 3.2 oz (60 kg)  11/16/18 135 lb 12.8 oz (61.6 kg)    Patient Active Problem List   Diagnosis Date Noted  . History of migraine 06/15/2018  . 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 Well  controlled.    Health Maintenance  Topic Date Due  . TETANUS/TDAP  02/15/2021 (Originally 07/26/2019)  . INFLUENZA VACCINE  04/21/2020  . PAP SMEAR-Modifier  11/19/2020  . COVID-19 Vaccine  Completed  . HIV Screening  Completed   Immunization History  Administered Date(s) Administered  . Hepatitis A 12/30/2015, 07/02/2016  . Influenza,inj,Quad PF,6+ Mos 06/15/2018  . Moderna SARS-COVID-2 Vaccination 12/14/2019, 01/16/2020  . Td 08/05/2004  . Tdap 07/25/2009   We updated and reviewed the patient's past history in detail and it is documented below. Allergies: Patient has No Known Allergies. Past Medical History Patient  has a past medical history of Allergy, Depression, Heart murmur, Major depression, recurrent, chronic (HCC) (06/15/2018), and Migraines. Past Surgical History Patient  has no past surgical history on file. Family History: Patient family history includes Arthritis in her paternal grandmother; Asthma in her paternal grandmother, sister, and sister; Bladder Cancer in her mother; Breast cancer in her paternal grandmother; COPD in her father; Early death in her paternal grandfather; Healthy in her daughter; Hearing loss in her father; Heart attack in her paternal grandfather; Hyperlipidemia in her maternal grandmother; Hypertension in her father and maternal grandmother; Kidney disease in her maternal grandfather; Lung cancer in her maternal grandfather; Miscarriages / [place] in her mother; Stroke in her mother. Social History:  Patient  reports that she quit smoking about 17 years ago. She has a 15.00 pack-year smoking history. She has never used smokeless tobacco. She reports current alcohol use. She reports that she does not use drugs.  Review of Systems: Constitutional: negative for fever or malaise Ophthalmic: negative for photophobia, double vision or loss of vision Cardiovascular: negative for chest pain, dyspnea on exertion, or new LE swelling Respiratory:  negative for SOB or persistent cough Gastrointestinal: negative for abdominal pain, change in bowel habits or melena Genitourinary: negative for dysuria or gross hematuria, no abnormal uterine bleeding or disharge Musculoskeletal: +for new gait disturbance or muscular weakness Integumentary: negative for new or persistent rashes, no breast lumps Neurological: negative for TIA or stroke symptoms Psychiatric: negative for SI or delusions Allergic/Immunologic: negative for hives  Patient Care Team    Relationship Specialty Notifications Start End  Willow Ora, MD PCP - General Family Medicine  06/15/18     Objective  Vitals: BP 118/74   Pulse 61   Temp 97.7 F (36.5 C) (Temporal)   Resp 18   Ht 5\' 2"  (1.575 m)   Wt 136 lb 9.6 oz (62 kg)   SpO2 98%   BMI 24.98 kg/m  General:  Well developed, well nourished, no acute distress  Psych:  Alert and orientedx3,normal mood and affect HEENT:  Normocephalic, atraumatic, non-icteric sclera,  supple neck without adenopathy, mass or thyromegaly Cardiovascular:  Normal S1, S2, RRR without gallop, rub or murmur Respiratory:  Good breath sounds bilaterally, CTAB  with normal respiratory effort Gastrointestinal: normal bowel sounds, soft, non-tender, no noted masses. No HSM MSK: no deformities, contusions. Joints are without erythema or swelling.  Skin:  Warm, no rashes or suspicious lesions noted Neurologic:    Mental status is normal. CN 2-11 are normal. Gross motor and sensory exams are normal. Normal gait. No tremor Breast Exam: No mass, skin retraction or nipple discharge is appreciated in either breast. No axillary adenopathy. Fibrocystic changes are not noted Back: ttp right lower lumbar area, FROM, neg seated SLR bilaterally with mild positive SLR on right lying; nl strength BLE. No knee reflex on right, +2 on left. + 2 ankle relfexes bilaterally, nl gait.    Commons side effects, risks, benefits, and alternatives for medications and  treatment plan prescribed today were discussed, and the patient expressed understanding of the given instructions. Patient is instructed to call or message via MyChart if he/she has any questions or concerns regarding our treatment plan. No barriers to understanding were identified. We discussed Red Flag symptoms and signs in detail. Patient expressed understanding regarding what to do in case of urgent or emergency type symptoms.   Medication list was reconciled, printed and provided to the patient in AVS. Patient instructions and summary information was reviewed with the patient as documented in the AVS. This note was prepared with assistance of Dragon voice recognition software. Occasional wrong-word or sound-a-like substitutions may have occurred due to the inherent limitations of voice recognition software  This visit occurred during the SARS-CoV-2 public health emergency.  Safety protocols were in place, including screening questions prior to the visit, additional usage of staff PPE, and extensive cleaning of exam room while observing appropriate contact time as indicated for disinfecting solutions.

## 2020-02-16 NOTE — Patient Instructions (Signed)
You are due a mammogram when you can.  Your next physical will be due in May 2021 in [place]. Call me next week if needed and best of luck!   Herniated Disk  A herniated disk, also called a ruptured disk or slipped disk, occurs when a disk in the spine bulges out too far. Between the bones in the spine (vertebrae), there are oval disks that are made of a soft, spongy center filled with liquid that is surrounded by a tough outer ring. The disks connect the vertebrae, help the spine move, and keep the bones from rubbing against each other when you move. When you have a herniated disk, the spongy center of the disk bulges out or breaks through the outer ring. The spongy center can press on a nerve between the vertebrae and cause pain. This can occur anywhere in the back or neck area, but the lower back is most commonly affected. What are the causes? This condition may be caused by:  Age-related wear and tear. The spongy centers of spinal disks tend to shrink and dry out with age, which makes them more likely to herniate.  Sudden injury, such as a strain or sprain. What increases the risk? The following factors may make you more likely to develop this condition:  Aging. This is the main risk factor for a herniated disk.  Being a man who is 67-58 years old.  Frequently doing activities that involve heavy lifting, bending, or twisting.  Not getting enough exercise.  Being overweight.  Using tobacco products. What are the signs or symptoms? Symptoms may vary depending on where your herniated disk is located.  A herniated disk in the lower back may cause sharp pain in: ? Part of the arm, leg, hip, or buttocks. ? The back of the lower leg (calf). ? The lower back, spreading down through the leg into the foot (sciatica).  A herniated disk in the neck may cause dizziness and vertigo. It may also cause pain or weakness in: ? The neck. ? The shoulder blades. ? The upper arm, forearm, or  fingers. You may also have muscle weakness. It may be difficult to:  Lift your leg or arm.  Stand on your toes.  Squeeze tightly with one of your hands. Other symptoms may include:  Numbness or tingling in the affected areas of the hands, arms, feet, or legs.  Inability to control when you urinate or when you have bowel movements. This is a rare but serious sign of a severe herniated disk in the lower back. How is this diagnosed? This condition may be diagnosed based on:  Your symptoms.  Your medical history.  A physical exam. The exam may include: ? A straight-leg test. For this test, you will lie on your back while your health care provider lifts your leg, keeping your knee straight. If you feel pain, you likely have a herniated disk. ? Neurologic tests. These include checking for numbness, reflexes, muscle strength, and problems with posture.  Imaging tests, such as: ? X-rays. ? MRI. ? CT scan. ? Electromyogram (EMG) to check the nerves that control muscles. This test may be used to determine which nerves are affected by your herniated disk. How is this treated? Treatment for this condition may include:  A short period of rest. This is usually the first treatment. ? You may be on bed rest for up to 2 days, or you may be instructed to stay home and avoid physical activity. ? If you  have a herniated disk in your lower back, avoid sitting as much as possible. Sitting increases pressure on the disk.  Medicines. These may include: ? NSAIDs, such as ibuprofen, to help reduce pain and swelling. ? Muscle relaxants to prevent sudden tightening of the back muscles (back spasms). ? Prescription pain medicines, if you have severe pain.  Ice or heat therapy.  Steroid injections in the area of the herniated disk. These can help reduce pain and swelling.  Physical therapy to strengthen your back muscles. In many cases, symptoms go away with treatment over a period of days or weeks.  You will most likely be free of symptoms after 3-4 months. If other treatments do not help to relieve your symptoms, you may need surgery. Follow these instructions at home: Medicines  Take over-the-counter and prescription medicines only as told by your health care provider.  Ask your health care provider if the medicine prescribed to you: ? Requires you to avoid driving or using heavy machinery. ? Can cause constipation. You may need to take these actions to prevent or treat constipation:  Drink enough fluid to keep your urine pale yellow.  Take over-the-counter or prescription medicines.  Eat foods that are high in fiber, such as beans, whole grains, and fresh fruits and vegetables.  Limit foods that are high in fat and processed sugars, such as fried or sweet foods. Managing pain, stiffness, and swelling      If directed, put ice on the painful area. Icing can help to relieve pain. To do this: ? Put ice in a plastic bag. ? Place a towel between your skin and the bag. ? Leave the ice on for 20 minutes, 2-3 times a day.  If directed, apply heat to the painful area as often as told by your health care provider. Heat can reduce the stiffness of your muscles. Use the heat source that your health care provider recommends, such as a moist heat pack or a heating pad. ? Place a towel between your skin and the heat source. ? Leave the heat on for 20-30 minutes. ? Remove the heat if your skin turns bright red. This is especially important if you are unable to feel pain, heat, or cold. You may have a greater risk of getting burned. Activity  Rest as told by your health care provider.  After your rest period: ? Return to your normal activities and gradually begin exercising as told by your health care provider. Ask your health care provider what activities and exercises are safe for you. ? Use good posture. ? Avoid movements that cause pain. ? Do not lift anything that is heavier than  10 lb (4.5 kg), or the limit that you are told, until your health care provider says that it is safe. ? Do not sit or stand for long periods of time without changing positions. ? Do not sit for long periods of time without getting up and moving around.  If physical therapy was prescribed, do exercises as told by your health care provider.  Aim to strengthen muscles in your back and abdomen with exercises such as swimming or walking. General instructions  Do not use any products that contain nicotine or tobacco, such as cigarettes, e-cigarettes, and chewing tobacco. These products can delay healing. If you need help quitting, ask your health care provider.  Do not wear high-heeled shoes.  Do not sleep on your abdomen.  If you are overweight, work with your health care provider to lose  weight safely.  Keep all follow-up visits as told by your health care provider. This is important. How is this prevented?   Maintain a healthy weight.  Maintain physical fitness. Do at least 150 minutes of moderate-intensity exercise each week, such as brisk walking or water aerobics.  When lifting objects: ? Keep your feet at least shoulder-width apart and tighten the muscles of your abdomen. ? Keep your spine neutral as you bend your knees and hips. It is important to lift using the strength of your legs, not your back. Do not lock your knees straight out. ? Always ask for help to lift heavy or awkward objects. Contact a health care provider if you:  Have back pain or neck pain that does not get better after 6 weeks.  Have severe pain in your back, neck, legs, or arms.  Develop numbness, tingling, or weakness anywhere in your body. Get help right away if:  You cannot move your arms or legs.  You cannot control when you urinate or have bowel movements.  You feel dizzy or you faint.  You have shortness of breath. Summary  A herniated disk, also called a ruptured disk or slipped disk, occurs  when a disk in the spine bulges out too far (herniates).  This condition may be caused by age-related wear and tear or a sudden injury.  Symptoms may vary depending on where your herniated disk is located.  Treatment may include rest, medicines, ice or heat therapy, steroid injections, and physical therapy.  If other treatments do not help to relieve your symptoms, you may need surgery. This information is not intended to replace advice given to you by your health care provider. Make sure you discuss any questions you have with your health care provider. Document Revised: 04/11/2019 Document Reviewed: 04/11/2019 Elsevier Patient Education  2020 [employer].

## 2020-02-17 LAB — LIPID PANEL
Cholesterol: 132 mg/dL (ref <200)
HDL: 55 mg/dL (ref >=50)
LDL Cholesterol (Calc): 63 mg/dL (calc)
Non-HDL Cholesterol (Calc): 77 mg/dL (calc) (ref <130)
Total CHOL/HDL Ratio: 2.4 (calc) (ref <5.0)
Triglycerides: 56 mg/dL (ref <150)

## 2020-02-17 LAB — TSH: TSH: 1.49 mIU/L

## 2020-02-17 LAB — COMPREHENSIVE METABOLIC PANEL
AG Ratio: 2 (calc) (ref 1.0–2.5)
ALT: 24 U/L (ref 6–29)
AST: 31 U/L (ref 10–35)
Albumin: 4.5 g/dL (ref 3.6–5.1)
Alkaline phosphatase (APISO): 35 U/L (ref 31–125)
BUN: 9 mg/dL (ref 7–25)
CO2: 27 mmol/L (ref 20–32)
Calcium: 9.3 mg/dL (ref 8.6–10.2)
Chloride: 96 mmol/L — ABNORMAL LOW (ref 98–110)
Creat: 0.85 mg/dL (ref 0.50–1.10)
Globulin: 2.2 g/dL (calc) (ref 1.9–3.7)
Glucose, Bld: 101 mg/dL — ABNORMAL HIGH (ref 65–99)
Potassium: 3.7 mmol/L (ref 3.5–5.3)
Sodium: 132 mmol/L — ABNORMAL LOW (ref 135–146)
Total Bilirubin: 0.8 mg/dL (ref 0.2–1.2)
Total Protein: 6.7 g/dL (ref 6.1–8.1)

## 2020-02-17 LAB — CBC WITH DIFFERENTIAL/PLATELET
Absolute Monocytes: 475 cells/uL (ref 200–950)
Basophils Absolute: 73 cells/uL (ref 0–200)
Basophils Relative: 1 %
Eosinophils Absolute: 110 cells/uL (ref 15–500)
Eosinophils Relative: 1.5 %
HCT: 39.3 % (ref 35.0–45.0)
Hemoglobin: 13.4 g/dL (ref 11.7–15.5)
Lymphs Abs: 2460 cells/uL (ref 850–3900)
MCH: 31.5 pg (ref 27.0–33.0)
MCHC: 34.1 g/dL (ref 32.0–36.0)
MCV: 92.3 fL (ref 80.0–100.0)
MPV: 10.1 fL (ref 7.5–12.5)
Monocytes Relative: 6.5 %
Neutro Abs: 4183 cells/uL (ref 1500–7800)
Neutrophils Relative %: 57.3 %
Platelets: 209 10*3/uL (ref 140–400)
RBC: 4.26 10*6/uL (ref 3.80–5.10)
RDW: 11.9 % (ref 11.0–15.0)
Total Lymphocyte: 33.7 %
WBC: 7.3 10*3/uL (ref 3.8–10.8)

## 2020-02-20 ENCOUNTER — Encounter: Payer: Self-pay | Admitting: Family Medicine

## 2020-04-19 ENCOUNTER — Other Ambulatory Visit: Payer: Self-pay

## 2020-04-19 ENCOUNTER — Telehealth: Payer: Self-pay | Admitting: Family Medicine

## 2020-04-19 MED ORDER — ESCITALOPRAM OXALATE 20 MG PO TABS: 10.0000 mg | ORAL_TABLET | Freq: Every day | ORAL | 1 refills | Status: DC

## 2020-04-19 NOTE — Telephone Encounter (Signed)
Refill sent.

## 2020-04-19 NOTE — Telephone Encounter (Signed)
..   LAST APPOINTMENT DATE: 02/16/2020   NEXT APPOINTMENT DATE:@Visit  date not found  MEDICATION:escitalopram (LEXAPRO) 20 MG tablet    PHARMACY: [facility] [address] [facility] [place] [address]  **Let patient know to contact pharmacy at the end of the day to make sure medication is ready. **  ** Please notify patient to allow 48-72 hours to process**  **Encourage patient to contact the pharmacy for refills or they can request refills through [facility]**  CLINICAL FILLS OUT ALL BELOW:   LAST REFILL:  QTY:  REFILL DATE:    OTHER COMMENTS:   Okay for refill?  Please advise

## 2021-04-29 ENCOUNTER — Ambulatory Visit: Payer: BC Managed Care – PPO | Admitting: Family Medicine

## 2021-05-19 ENCOUNTER — Ambulatory Visit: Payer: BC Managed Care – PPO | Admitting: Family Medicine

## 2021-06-23 ENCOUNTER — Other Ambulatory Visit (HOSPITAL_COMMUNITY)
Admission: RE | Admit: 2021-06-23 | Discharge: 2021-06-23 | Disposition: A | Discharge status: Home / Self Care | Payer: BC Managed Care – PPO | Source: Ambulatory Visit | Attending: Family Medicine | Admitting: Family Medicine

## 2021-06-23 ENCOUNTER — Encounter: Payer: Self-pay | Admitting: Family Medicine

## 2021-06-23 ENCOUNTER — Other Ambulatory Visit: Payer: Self-pay

## 2021-06-23 ENCOUNTER — Ambulatory Visit: Payer: BC Managed Care – PPO | Admitting: Family Medicine

## 2021-06-23 VITALS — BP 120/80 | HR 54 | Temp 98.1°F | Ht 62.0 in | Wt 145.8 lb

## 2021-06-23 DIAGNOSIS — Z0000 Encounter for general adult medical examination without abnormal findings: Secondary | ICD-10-CM

## 2021-06-23 DIAGNOSIS — Z1211 Encounter for screening for malignant neoplasm of colon: Secondary | ICD-10-CM

## 2021-06-23 DIAGNOSIS — M47816 Spondylosis without myelopathy or radiculopathy, lumbar region: Secondary | ICD-10-CM | POA: Insufficient documentation

## 2021-06-23 DIAGNOSIS — Z23 Encounter for immunization: Secondary | ICD-10-CM

## 2021-06-23 DIAGNOSIS — F339 Major depressive disorder, recurrent, unspecified: Secondary | ICD-10-CM | POA: Diagnosis not present

## 2021-06-23 DIAGNOSIS — Z1212 Encounter for screening for malignant neoplasm of rectum: Secondary | ICD-10-CM

## 2021-06-23 DIAGNOSIS — Z124 Encounter for screening for malignant neoplasm of cervix: Secondary | ICD-10-CM | POA: Insufficient documentation

## 2021-06-23 DIAGNOSIS — Z1231 Encounter for screening mammogram for malignant neoplasm of breast: Secondary | ICD-10-CM

## 2021-06-23 NOTE — Progress Notes (Signed)
Subjective  Chief Complaint  Patient presents with   Annual Exam   Depression   Migraine    HPI: Tiffany Knox is a 50 y.o. female who presents to [facility] [place] [facility] today for a Female Wellness Visit. She also has the concerns and/or needs as listed above in the chief complaint. These will be addressed in addition to the Health Maintenance Visit.   Wellness Visit: annual visit with health maintenance review and exam with Pap  HM: due pap and crc screen. Tdap and flu. Healthy lifestyle. Moved to [place] and then returned back here in July.  Chronic disease f/u and/or acute problem visit: (deemed necessary to be done in addition to the wellness visit): Chronic depression; no longer on lexapro: doing well. Feels better off meds. No low mood. Handling stressors of moving back fairly well but it has been a long year  Reports low back pain: had work up and PT in [place]: reports MRI showed herniated disc and "fractures". Also had stress tib fracture from running.   Assessment  1. Annual physical exam   2. Major depression, recurrent, chronic (HCC)   3. Cervical cancer screening   4. Screening for colorectal cancer   5. Encounter for screening mammogram for malignant neoplasm of breast   6. Spondylosis of lumbar region without myelopathy or radiculopathy      Plan  Female Wellness Visit: Age appropriate Health Maintenance and Prevention measures were discussed with patient. Included topics are cancer screening recommendations, ways to keep healthy (see AVS) including dietary and exercise recommendations, regular eye and dental care, use of seat belts, and avoidance of moderate alcohol use and tobacco use. Pt to consider CRC. Defers for now. Mammo ordered. Pap and hr hpv screen done today.  BMI: discussed patient's BMI and encouraged positive lifestyle modifications to help get to or maintain a target BMI. HM needs and immunizations were addressed and ordered. See below for  orders. See HM and immunization section for updates. Flu and tdap today. Routine labs and screening tests ordered including cmp, cbc and lipids where appropriate. Discussed recommendations regarding Vit D and calcium supplementation (see AVS)  Chronic disease management visit and/or acute problem visit: Depression: resolved, remission. Monitor.  Low back pain: to request records.   Follow up: 12 mo for cpe  Orders Placed This Encounter  Procedures   MM DIGITAL SCREENING BILATERAL   CBC with Differential/Platelet   Comprehensive metabolic panel   Hepatitis C antibody   Lipid panel   TSH   No orders of the defined types were placed in this encounter.     Body mass index is 26.67 kg/m. 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 Well controlled.    Health Maintenance  Topic Date Due   Hepatitis C Screening  Never done   COLONOSCOPY (Pts 45-43yrs Insurance coverage will need to be confirmed)  Never done   TETANUS/TDAP  07/26/2019   INFLUENZA VACCINE  04/21/2021   PAP SMEAR-Modifier  11/20/2022   COVID-19 Vaccine  Completed   HIV Screening  Completed   HPV VACCINES  Aged Out   Immunization History  Administered Date(s) Administered   Hepatitis A 12/30/2015, 07/02/2016   Influenza,inj,Quad PF,6+ Mos 06/15/2018   Moderna SARS-COV2 Booster Vaccination 09/10/2020   Moderna Sars-Covid-2 Vaccination 12/14/2019, 01/16/2020   Td 08/05/2004   Tdap 07/25/2009   We updated and reviewed the patient's past history in detail and it is documented  below. Allergies: Patient has No Known Allergies. Past Medical History Patient  has a past medical history of Allergy, Depression, Heart murmur, Major depression, recurrent, chronic (HCC) (06/15/2018), and Migraines. Past Surgical History Patient  has no past surgical history on file. Family History: Patient family history includes Arthritis in her paternal grandmother; Asthma in her paternal grandmother, sister, and sister; Bladder Cancer in her mother; Breast cancer in her paternal grandmother; COPD in her father; Early death in her paternal grandfather; Healthy in her daughter; Hearing loss in her father; Heart attack in her paternal grandfather; Hyperlipidemia in her maternal grandmother; Hypertension in her father and maternal grandmother; Kidney disease in her maternal grandfather; Lung cancer in her maternal grandfather; Miscarriages / [place] in her mother; Stroke in her mother. Social History:  Patient  reports that she quit smoking about 18 years ago. Her smoking use included cigarettes. She has a 15.00 pack-year smoking history. She has never used smokeless tobacco. She reports current alcohol use. She reports that she does not use drugs.  Review of Systems: Constitutional: negative for fever or malaise Ophthalmic: negative for photophobia, double vision or loss of vision Cardiovascular: negative for chest pain, dyspnea on exertion, or new LE swelling Respiratory: negative for SOB or persistent cough Gastrointestinal: negative for abdominal pain, change in bowel habits or melena Genitourinary: negative for dysuria or gross hematuria, no abnormal uterine bleeding or disharge Musculoskeletal: negative for new gait disturbance or muscular weakness Integumentary: negative for new or persistent rashes, no breast lumps Neurological: negative for TIA or stroke symptoms Psychiatric: negative for SI or delusions Allergic/Immunologic: negative for hives  Patient Care Team     Relationship Specialty Notifications Start End  Willow Ora, MD PCP - General Family Medicine  04/22/21     Objective  Vitals: BP 120/80   Pulse (!) 54   Temp 98.1 F (36.7 C) (Temporal)   Ht 5\' 2"  (1.575 m)   Wt 145 lb 12.8 oz (66.1 kg)   LMP 06/01/2021 (Exact Date)   SpO2 100%   BMI 26.67 kg/m  General:  Well developed, well nourished, no acute distress  Psych:  Alert and orientedx3,normal mood and affect HEENT:  Normocephalic, atraumatic, non-icteric sclera,  supple neck without adenopathy, mass or thyromegaly Cardiovascular:  Normal S1, S2, RRR without gallop, rub or murmur Respiratory:  Good breath sounds bilaterally, CTAB with normal respiratory effort Gastrointestinal: normal bowel sounds, soft, non-tender, no noted masses. No HSM MSK: no deformities, contusions. Joints are without erythema or swelling.  Skin:  Warm, no rashes or suspicious lesions noted Neurologic:    Mental status is normal. CN 2-11 are normal. Gross motor and sensory exams are normal. Normal gait. No tremor Breast Exam: No mass, skin retraction or nipple discharge is appreciated in either breast. No axillary adenopathy. Fibrocystic changes are not noted Pelvic Exam: Normal external genitalia, no vulvar or vaginal lesions present. Clear cervix w/o CMT. Bimanual exam reveals a nontender fundus w/o masses, nl size. No adnexal masses present. No inguinal adenopathy. A PAP smear was performed.   Commons side effects, risks,  benefits, and alternatives for medications and treatment plan prescribed today were discussed, and the patient expressed understanding of the given instructions. Patient is instructed to call or message via MyChart if he/she has any questions or concerns regarding our treatment plan. No barriers to understanding were identified. We discussed Red Flag symptoms and signs in detail. Patient expressed understanding regarding what to do in case of urgent or emergency type symptoms.  Medication list  was reconciled, printed and provided to the patient in AVS. Patient instructions and summary information was reviewed with the patient as documented in the AVS. This note was prepared with assistance of Dragon voice recognition software. Occasional wrong-word or sound-a-like substitutions may have occurred due to the inherent limitations of voice recognition software  This visit occurred during the SARS-CoV-2 public health emergency.  Safety protocols were in place, including screening questions prior to the visit, additional usage of staff PPE, and extensive cleaning of exam room while observing appropriate contact time as indicated for disinfecting solutions.

## 2021-06-23 NOTE — Patient Instructions (Addendum)
Please return in 12 months for your annual complete physical; please come fasting.   I will release your lab results to you on your MyChart account with further instructions. Please reply with any questions.    Today you were given your Flu and Tdap vaccinations.    Consider getting a colonoscopy or performing the Cologuard test for colon cancer screening. Send me a message if you'd like me to order for your.   I have ordered a mammogram and/or bone density for you as we discussed today: [x]   Mammogram  []   Bone Density  Please call the office checked below to schedule your appointment:  [x]   [facility]      [address] [place],        [address] East Wing         []   [facility]  [address] [place],  [facility]   If you have any questions or concerns, please don't hesitate to send me a message via MyChart or call the office at [phone]. Thank you for visiting with [facility] today! It's our pleasure caring for you.   Please do these things to maintain good health!  Exercise at least 30-45 minutes a day,  4-5 days a week.  Eat a low-fat diet with lots of fruits and vegetables, up to 7-9 servings per day. Drink plenty of water daily. Try to drink 8 8oz glasses per day. Seatbelts can save your life. Always wear your seatbelt. Place Smoke Detectors on every level of your home and check batteries every year. Schedule an appointment with an eye doctor for an eye exam every 1-2 years Safe sex - use condoms to protect yourself from STDs if you could be exposed to these types of infections. Use birth control if you do not want to become pregnant and are sexually active. Avoid heavy alcohol use. If you drink, keep it to less than 2 drinks/day and not every day. Health Care Power of Attorney.  Choose someone you trust that could speak for you if you became unable to speak for yourself. Depression is common in our stressful  world.If you're feeling down or losing interest in things you normally enjoy, please come in for a visit. If anyone is threatening or hurting you, please get help. Physical or Emotional Violence is never OK.    Colorectal Cancer Screening Colorectal cancer screening is a group of tests that are used to check for colorectal cancer before symptoms develop. Colorectal refers to the colon and rectum. The colon and rectum are located at the end of the digestive tract and carry stool (feces) out of the body. Who should have screening? All adults who are 1-53 years old should have screening. Your health care provider may recommend screening before age 37. You will have tests every 1-10 years, depending on your results and the type of screening test. Screening recommendations for adults who are 73-35 years old vary depending on a person's health. People older than age 67 should no longer get colorectal cancer screening. You may have screening tests starting before age 71, or more often than other people, if you have any of these risk factors: A personal or family history of colorectal cancer or abnormal growths known as polyps in your colon. Inflammatory bowel disease, such as ulcerative colitis or Crohn's disease. A history of having radiation treatment to the abdomen or the area between the hip bones (  pelvic area) for cancer. A type of genetic syndrome that is passed from parent to child (hereditary), such as: Lynch syndrome. Familial adenomatous polyposis. Turcot syndrome. Peutz-Jeghers syndrome. MUTYH-associated polyposis (MAP). A personal history of diabetes. Types of tests There are several types of colorectal screening tests. You may have one or more of the following: Guaiac-based fecal occult blood testing. For this test, a stool sample is checked for hidden (occult) blood, which could be a sign of colorectal cancer. Fecal immunochemical test (FIT). For this test, a stool sample is checked for  blood, which could be a sign of colorectal cancer. Stool DNA test. For this test, a stool sample is checked for blood and changes in DNA that could lead to colorectal cancer. Sigmoidoscopy. During this test, a thin, flexible tube with a camera on the end, called a sigmoidoscope, is used to examine the rectum and the lower colon. Colonoscopy. During this test, a long, flexible tube with a camera on the end, called a colonoscope, is used to examine the entire colon and rectum. Also, sometimes a tissue sample is taken to be looked at under a microscope (biopsy) or small polyps are removed during this test. Virtual colonoscopy. Instead of a colonoscope, this type of colonoscopy uses a CT scan to take pictures of the colon and rectum. A CT scan is a type of X-ray that is made using computers. What are the benefits of screening? Screening reduces your risk for colorectal cancer and can help identify cancer at an early stage, when the cancer can be removed or treated more easily. It is common for polyps to form in the lining of the colon, especially as you age. These polyps may be cancerous or become cancerous over time. Screening can identify these polyps. What are the risks of screening? Generally, these are safe tests. However, problems may occur, including: The need for more tests to confirm results from a stool sample test. Stool sample tests have fewer risks than other types of screening tests. Being exposed to low levels of radiation, if you had a test involving X-rays. This may slightly increase your cancer risk. The benefit of detecting cancer outweighs the slight increase in risk. Bleeding, damage to the intestine, or infection caused by a sigmoidoscopy or colonoscopy. A reaction to medicines given during a sigmoidoscopy or colonoscopy. Talk with your health care provider to understand your risk for colorectal cancer and to make a screening plan that is right for you. Questions to ask your health  care provider When should I start colorectal cancer screening? What is my risk for colorectal cancer? How often do I need screening? Which screening tests do I need? How do I get my test results? What do my results mean? Where to find more information Learn more about colorectal cancer screening from: The American Cancer Society: cancer.org National Cancer Institute: cancer.gov Summary Colorectal cancer screening is a group of tests used to check for colorectal cancer before symptoms develop. All adults who are 19-16 years old should have screening. Your health care provider may recommend screening before age 3. You may have screening tests starting before age 51, or more often than other people, if you have certain risk factors. Screening reduces your risk for colorectal cancer and can help identify cancer at an early stage, when the cancer can be removed or treated more easily. Talk with your health care provider to understand your risk for colorectal cancer and to make a screening plan that is right for you. This information  is not intended to replace advice given to you by your health care provider. Make sure you discuss any questions you have with your health care provider. Document Revised: 12/27/2019 Document Reviewed: 12/27/2019 Elsevier Patient Education  2022 [employer].

## 2021-06-24 LAB — COMPREHENSIVE METABOLIC PANEL
ALT: 19 U/L (ref 0–35)
AST: 28 U/L (ref 0–37)
Albumin: 4.7 g/dL (ref 3.5–5.2)
Alkaline Phosphatase: 37 U/L — ABNORMAL LOW (ref 39–117)
BUN: 20 mg/dL (ref 6–23)
CO2: 29 mEq/L (ref 19–32)
Calcium: 9.5 mg/dL (ref 8.4–10.5)
Chloride: 102 mEq/L (ref 96–112)
Creatinine, Ser: 0.95 mg/dL (ref 0.40–1.20)
GFR: 71.14 mL/min (ref >60.00)
Glucose, Bld: 103 mg/dL — ABNORMAL HIGH (ref 70–99)
Potassium: 3.8 mEq/L (ref 3.5–5.1)
Sodium: 139 mEq/L (ref 135–145)
Total Bilirubin: 0.5 mg/dL (ref 0.2–1.2)
Total Protein: 7.2 g/dL (ref 6.0–8.3)

## 2021-06-24 LAB — CBC WITH DIFFERENTIAL/PLATELET
Basophils Absolute: 0 10*3/uL (ref 0.0–0.1)
Basophils Relative: 0.4 % (ref 0.0–3.0)
Eosinophils Absolute: 0.1 10*3/uL (ref 0.0–0.7)
Eosinophils Relative: 1.8 % (ref 0.0–5.0)
HCT: 41.7 % (ref 36.0–46.0)
Hemoglobin: 14.1 g/dL (ref 12.0–15.0)
Lymphocytes Relative: 31.4 % (ref 12.0–46.0)
Lymphs Abs: 2.2 10*3/uL (ref 0.7–4.0)
MCHC: 33.8 g/dL (ref 30.0–36.0)
MCV: 93.4 fl (ref 78.0–100.0)
Monocytes Absolute: 0.5 10*3/uL (ref 0.1–1.0)
Monocytes Relative: 7.1 % (ref 3.0–12.0)
Neutro Abs: 4.2 10*3/uL (ref 1.4–7.7)
Neutrophils Relative %: 59.3 % (ref 43.0–77.0)
Platelets: 233 10*3/uL (ref 150.0–400.0)
RBC: 4.46 Mil/uL (ref 3.87–5.11)
RDW: 12.7 % (ref 11.5–15.5)
WBC: 7 10*3/uL (ref 4.0–10.5)

## 2021-06-24 LAB — LIPID PANEL
Cholesterol: 157 mg/dL (ref 0–200)
HDL: 63.9 mg/dL (ref >39.00)
LDL Cholesterol: 77 mg/dL (ref 0–99)
NonHDL: 93.37
Total CHOL/HDL Ratio: 2
Triglycerides: 81 mg/dL (ref 0.0–149.0)
VLDL: 16.2 mg/dL (ref 0.0–40.0)

## 2021-06-24 LAB — HEPATITIS C ANTIBODY
Hepatitis C Ab: NONREACTIVE
SIGNAL TO CUT-OFF: 0.01 (ref <1.00)

## 2021-06-24 LAB — TSH: TSH: 2.21 u[IU]/mL (ref 0.35–5.50)

## 2021-06-26 ENCOUNTER — Encounter: Payer: Self-pay | Admitting: Family Medicine

## 2021-06-30 LAB — CYTOLOGY - PAP
Comment: NEGATIVE
Diagnosis: NEGATIVE
High risk HPV: NEGATIVE

## 2021-07-01 ENCOUNTER — Ambulatory Visit: Payer: BC Managed Care – PPO | Admitting: Family Medicine

## 2021-07-22 DIAGNOSIS — F4320 Adjustment disorder, unspecified: Secondary | ICD-10-CM | POA: Diagnosis not present

## 2021-07-29 DIAGNOSIS — F4320 Adjustment disorder, unspecified: Secondary | ICD-10-CM | POA: Diagnosis not present

## 2021-08-05 DIAGNOSIS — F4320 Adjustment disorder, unspecified: Secondary | ICD-10-CM | POA: Diagnosis not present

## 2021-08-19 DIAGNOSIS — F4320 Adjustment disorder, unspecified: Secondary | ICD-10-CM | POA: Diagnosis not present

## 2021-08-20 ENCOUNTER — Ambulatory Visit
Admission: RE | Admit: 2021-08-20 | Discharge: 2021-08-20 | Disposition: A | Discharge status: Home / Self Care | Payer: BC Managed Care – PPO | Source: Ambulatory Visit | Attending: Family Medicine | Admitting: Family Medicine

## 2021-08-20 DIAGNOSIS — Z0000 Encounter for general adult medical examination without abnormal findings: Secondary | ICD-10-CM

## 2021-08-20 DIAGNOSIS — Z1231 Encounter for screening mammogram for malignant neoplasm of breast: Secondary | ICD-10-CM | POA: Diagnosis not present

## 2021-08-22 ENCOUNTER — Other Ambulatory Visit: Payer: Self-pay | Admitting: Family Medicine

## 2021-08-22 DIAGNOSIS — R928 Other abnormal and inconclusive findings on diagnostic imaging of breast: Secondary | ICD-10-CM

## 2021-08-26 DIAGNOSIS — F4320 Adjustment disorder, unspecified: Secondary | ICD-10-CM | POA: Diagnosis not present

## 2021-09-02 DIAGNOSIS — F4320 Adjustment disorder, unspecified: Secondary | ICD-10-CM | POA: Diagnosis not present

## 2021-09-09 DIAGNOSIS — F4320 Adjustment disorder, unspecified: Secondary | ICD-10-CM | POA: Diagnosis not present

## 2021-09-23 DIAGNOSIS — F4320 Adjustment disorder, unspecified: Secondary | ICD-10-CM | POA: Diagnosis not present

## 2021-09-24 ENCOUNTER — Ambulatory Visit
Admission: RE | Admit: 2021-09-24 | Discharge: 2021-09-24 | Disposition: A | Discharge status: Home / Self Care | Payer: BC Managed Care – PPO | Source: Ambulatory Visit | Attending: Family Medicine | Admitting: Family Medicine

## 2021-09-24 ENCOUNTER — Other Ambulatory Visit: Payer: Self-pay | Admitting: Family Medicine

## 2021-09-24 DIAGNOSIS — N6489 Other specified disorders of breast: Secondary | ICD-10-CM

## 2021-09-24 DIAGNOSIS — R922 Inconclusive mammogram: Secondary | ICD-10-CM | POA: Diagnosis not present

## 2021-09-24 DIAGNOSIS — R928 Other abnormal and inconclusive findings on diagnostic imaging of breast: Secondary | ICD-10-CM

## 2021-09-25 ENCOUNTER — Encounter: Payer: Self-pay | Admitting: Family Medicine

## 2021-09-26 ENCOUNTER — Other Ambulatory Visit: Payer: Self-pay

## 2021-09-26 ENCOUNTER — Encounter: Payer: Self-pay | Admitting: Family Medicine

## 2021-09-26 MED ORDER — ESCITALOPRAM OXALATE 10 MG PO TABS: 10.0000 mg | ORAL_TABLET | Freq: Every day | ORAL | 0 refills | Status: DC

## 2021-09-30 DIAGNOSIS — F4320 Adjustment disorder, unspecified: Secondary | ICD-10-CM | POA: Diagnosis not present

## 2021-10-14 DIAGNOSIS — F4320 Adjustment disorder, unspecified: Secondary | ICD-10-CM | POA: Diagnosis not present

## 2021-10-28 DIAGNOSIS — F4320 Adjustment disorder, unspecified: Secondary | ICD-10-CM | POA: Diagnosis not present

## 2021-11-11 DIAGNOSIS — F4320 Adjustment disorder, unspecified: Secondary | ICD-10-CM | POA: Diagnosis not present

## 2021-11-19 ENCOUNTER — Encounter: Payer: Self-pay | Admitting: Family Medicine

## 2021-11-19 NOTE — Telephone Encounter (Signed)
Please advise 

## 2021-11-21 ENCOUNTER — Other Ambulatory Visit: Payer: Self-pay

## 2021-11-21 DIAGNOSIS — G8929 Other chronic pain: Secondary | ICD-10-CM

## 2021-11-21 DIAGNOSIS — M549 Dorsalgia, unspecified: Secondary | ICD-10-CM

## 2021-11-21 NOTE — Telephone Encounter (Signed)
Pt called and referral placed for PT at [facility] adivsed someone would reach out to her to get scheduled.  ?

## 2021-11-25 DIAGNOSIS — F4320 Adjustment disorder, unspecified: Secondary | ICD-10-CM | POA: Diagnosis not present

## 2021-12-01 ENCOUNTER — Encounter: Payer: Self-pay | Admitting: Physical Therapy

## 2021-12-01 ENCOUNTER — Ambulatory Visit: Payer: BC Managed Care – PPO | Admitting: Physical Therapy

## 2021-12-01 DIAGNOSIS — M6281 Muscle weakness (generalized): Secondary | ICD-10-CM

## 2021-12-01 DIAGNOSIS — G8929 Other chronic pain: Secondary | ICD-10-CM

## 2021-12-01 DIAGNOSIS — M549 Dorsalgia, unspecified: Secondary | ICD-10-CM

## 2021-12-01 NOTE — Therapy (Signed)
OUTPATIENT PHYSICAL THERAPY THORACOLUMBAR EVALUATION   Patient Name: Tiffany Knox MRN: [id number] DOB:April 15, 1971, 51 y.o., female Today's Date: 12/01/2021   PT End of Session - 12/01/21 0847     Visit Number 1    Authorization Type 40 VL    Authorization - Visit Number 1    Authorization - Number of Visits 40    PT Start Time 0848    PT Stop Time 0918    PT Time Calculation (min) 30 min             Past Medical History:  Diagnosis Date   Allergy    Depression    Heart murmur    Major depression, recurrent, chronic (HCC) 06/15/2018   Migraines    History reviewed. No pertinent surgical history. Patient Active Problem List   Diagnosis Date Noted   Spondylosis of lumbar region without myelopathy or radiculopathy 06/23/2021   History of migraine 06/15/2018   Major depression, recurrent, chronic (HCC) 06/15/2018    PCP: Willow Ora, MD  REFERRING PROVIDER: Willow Ora, MD  REFERRING DIAG: [id number] (ICD-10-CM) - Chronic back pain, unspecified back location, unspecified back pain laterality  THERAPY DIAG:  Chronic back pain, unspecified back location, unspecified back pain laterality  Muscle weakness (generalized)  ONSET DATE: 2 years ago  SUBJECTIVE:                                                                                                                                                                                           SUBJECTIVE STATEMENT: States that she hurt her back in may 2021. States she had an MRI and she had a couple of breaks in her spine and they think she was born with those breaks. States that she hurt herself working out and she does have pain. States that she had imaging in [place] and moved to [place]. States while she went to PT then and that helped. States it used to be so bad she couldn't walk States she works out and uses free [employer] and follows a program. States when she hurt herself she had dumbbells  and she had to lift  them and push them out with rotation. States she had immediate pain and it went into her right leg and she could barely walk. States that traction felt great while she was in PT.  PERTINENT HISTORY:  left tibial fracture - stress  PAIN:  Are you having pain? Yes: NPRS scale: 2/10 Pain location: right lower back Pain description: dull ache Aggravating factors: run a couple miles 3x a week.  Relieving factors: traction, lumbar flexion   PRECAUTIONS: None  WEIGHT BEARING RESTRICTIONS No  FALLS:  Has patient fallen in last 6 months? No, Number of falls: 0   OCCUPATION: marketing - sits a lot  PLOF: Independent  PATIENT GOALS reminding her of what she should be doing to prevent additional injury   OBJECTIVE:   DIAGNOSTIC FINDINGS:  Previous imaging but none on file    SCREENING FOR RED FLAGS: Bowel or bladder incontinence: No Spinal tumors: No Cauda equina syndrome: No Compression fracture: No Abdominal aneurysm: No  COGNITION:  Overall cognitive status: Within functional limits for tasks assessed     SENSATION: WNL  MUSCLE LENGTH: Hamstrings: NL B  POSTURE:  flat back, limited motion in lumbar spline noted  PALPATION: Tenderness to palpation along right lumbar paraspinals. Increased resting tone throughout right lumbar and glutes. No pain with spring testing of lumbar spine but hypomobility noted  LUMBAR ROM:   Active  A/PROM  12/01/2021  Flexion 25% limited felt good  Extension 100% limited pain in right side low back  Right lateral flexion 75% limited pain in right low back  Left lateral flexion No pain to left 50% limited  Right rotation 100% limited pain on right side  Left rotation 50% limited no pain change    (Blank rows = not tested)     LE Measurements Lower Extremity Right 12/01/2021 Left 12/01/2021   A/PROM MMT A/PROM MMT  Hip Flexion WNL 4+ WNL 4+  Hip Extension WNL 4^ WNL 4+  Hip Abduction      Hip Adduction      Hip Internal  rotation WNL*  WNL*   Hip External rotation WNL  WNL   Knee Flexion  4  4  Knee Extension WNL 5 WNL 5  Ankle Dorsiflexion  5  5  Ankle Plantarflexion      Ankle Inversion      Ankle Eversion       (Blank rows = not tested)  * pain and guarding in right PSIS  ^lumbar extension and right rotation to achieve movement   LUMBAR SPECIAL TESTS:  Straight leg raise test: Negative, Slump test: negative, and FABER test: Negative       TODAY'S TREATMENT  12/01/2021 Therapeutic Exercise:  Aerobic:  Supine:  Prone:on elbows x5 10" holds  Seated: lumbar traction x5 15" holds  Standing: Neuromuscular Re-education: Manual Therapy: Therapeutic Activity: Self Care: Trigger Point Dry Needling:  Modalities:     PATIENT EDUCATION:  Education details: on current presentation, on HEP, on clinical outcomes score and POC Person educated: Patient Education method: Explanation, Demonstration, and Handouts Education comprehension: verbalized understanding    HOME EXERCISE PROGRAM: [id number]  ASSESSMENT:  CLINICAL IMPRESSION: Patient is a 51 y.o. female who was seen today for physical therapy evaluation and treatment for intermittent low back pain. Patient with minimal pain on this date but with fear avoidance into loaded lumbar extension, right rotation and right side bending. Patient with overall good strength but limitations in above motions and pain with these motions. Answered all questions and patient would benefit from skilled PT at this time to improve overall function and quality of life and to prevent re-injury to lumbar spine.    OBJECTIVE IMPAIRMENTS decreased activity tolerance, decreased ROM, decreased strength, impaired flexibility, improper body mechanics, postural dysfunction, and pain.   ACTIVITY LIMITATIONS  working out .   PERSONAL FACTORS Age, Past/current experiences, and Time since onset of injury/illness/exacerbation are also affecting patient's functional outcome.     REHAB POTENTIAL: Good  CLINICAL DECISION MAKING:  Stable/uncomplicated  EVALUATION COMPLEXITY: Low   GOALS: Goals reviewed with patient?  yes  SHORT TERM GOALS:  Patient will be independent in self management strategies to improve quality of life and functional outcomes. Baseline: new program Target date: 12/29/2021 Goal status: INITIAL  2.  Patient will report at least 50% improvement in overall symptoms and/or function to demonstrate improved functional mobility Baseline: 0% Target date: 12/29/2021 Goal status: INITIAL  3.  Patient will be able to demonstrate at least 50% ROM in lumbar spine in all directions to demonstrate improved gross lumbar mobility Target date: 12/29/2021 Goal status: INITIAL     LONG TERM GOALS: Patient will report at least 75% improvement in overall symptoms and/or function to demonstrate improved functional mobility Baseline: 0% Target date: 01/26/2022 Goal status: INITIAL  2.  Patient will be able to demonstrate squats and lifts with good form to reduce risk of re-injury Target date: 01/26/2022 Goal status: INITIAL  3.  Patient will be able to demonstrate loaded lumbar extension and Rotation movements without pain or compensation to return to PLOF Target date: 01/26/2022      PLAN: PT FREQUENCY: 1-2x/week for total of 12 visits over 8 week certifiation  PT DURATION: 8 weeks  PLANNED INTERVENTIONS: Therapeutic exercises, Therapeutic activity, Neuromuscular re-education, Balance training, Gait training, Patient/Family education, Joint manipulation, Joint mobilization, DME instructions, Aquatic Therapy, Dry Needling, Electrical stimulation, Spinal manipulation, Cryotherapy, Moist heat, and Manual therapy.  PLAN FOR NEXT SESSION: traction, unweigted lumbar ROM ROT/EXT/SB, core activation, glute activation, hip ROM and isometrics  8:47 AM, 12/01/21 Tereasa Coop, DPT Physical Therapy with [facility]  [phone]  office

## 2021-12-04 NOTE — Therapy (Signed)
?OUTPATIENT PHYSICAL THERAPY TREATMENT NOTE ? ? ?Patient Name: Tiffany Knox ?MRN: [id number] ?DOB:12/13/1970, 51 y.o., female ?Today's Date: 12/08/2021 ? ?PCP: Willow OraAndy, Camille L, MD ?REFERRING PROVIDER: Willow OraAndy, Camille L, MD ? ? PT End of Session - 12/08/21 0806   ? ? Visit Number 2   ? Number of Visits 12   ? Date for PT Re-Evaluation 01/26/22   ? Authorization Type 40 VL   ? Authorization - Visit Number 2   ? Authorization - Number of Visits 40   ? PT Start Time [phone]   ? PT Stop Time 0844   ? PT Time Calculation (min) 38 min   ? Activity Tolerance Patient tolerated treatment well   ? Behavior [facility] for tasks assessed/performed   ? ?  ?  ? ?  ? ? ?Past Medical History:  ?Diagnosis Date  ? Allergy   ? Depression   ? Heart murmur   ? Major depression, recurrent, chronic (HCC) 06/15/2018  ? Migraines   ? ?History reviewed. No pertinent surgical history. ?Patient Active Problem List  ? Diagnosis Date Noted  ? 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 History of migraine 06/15/2018  ? Major depression, recurrent, chronic (HCC) 06/15/2018  ? ? ?PCP: Willow OraAndy, Camille L, MD ?  ?REFERRING PROVIDER: Willow OraAndy, Camille L, MD ?  ?REFERRING DIAG: M54.9,G89.29 (ICD-10-CM) - Chronic back pain, unspecified back location, unspecified back pain laterality ?  ?THERAPY DIAG:  ?Chronic back pain, unspecified back location, unspecified back pain laterality ?  ?Muscle weakness (generalized) ?  ?ONSET DATE: 2 years ago ?  ?SUBJECTIVE:                                                                                                                                                                                          ?  ?SUBJECTIVE STATEMENT: ?12/08/2021 ?States that she has a little soreness but no real pain.States she likes the traction exercise but that it is difficult for her to perform as she doesn't have a chair with arms at home. ? ?Eval: States that she hurt her back in may 2021. States she had  an MRI and she had a couple of breaks in her spine and they think she was born with those breaks. States that she hurt herself working out and she does have pain. [place] that [place]. States while she went to PT then and that helped. States it used to be so bad she couldn't walk ?States she works out and uses free [employer] and follows a program. States when she hurt herself she  had dumbbells  and she had to lift them and push them out with rotation. States she had immediate pain and it went into her right leg and she could barely walk. States that traction felt great while she was in PT. ?  ?PERTINENT HISTORY:  ?left tibial fracture - stress ?  ?PAIN:  ?Are you having pain? Yes: NPRS scale: 5/10 ?Pain location: right lower back ?Pain description: soreness ?Aggravating factors: run a couple miles 3x a week.  ?Relieving factors: traction, lumbar flexion ?  ?  ?PRECAUTIONS: None ?  ?WEIGHT BEARING RESTRICTIONS No ?  ?FALLS:  ?Has patient fallen in last 6 months? No, Number of falls: 0 ?  ?  ?OCCUPATION: marketing - sits a lot ?  ?PLOF: Independent ?  ?PATIENT GOALS reminding her of what she should be doing to prevent additional injury ?  ?  ?OBJECTIVE:  ?  ?DIAGNOSTIC FINDINGS:  ?Previous imaging but none on file ?   ?PALPATION: ?Tenderness to palpation along right lumbar paraspinals. Increased resting tone throughout right lumbar and glutes. No pain with spring testing of lumbar spine but hypomobility noted ?  ?LUMBAR ROM:  ?  ?Active  A/PROM  ?12/01/2021  ?Flexion 25% limited felt good  ?Extension 100% limited pain in right side low back  ?Right lateral flexion 75% limited pain in right low back  ?Left lateral flexion No pain to left 50% limited  ?Right rotation 100% limited pain on right side  ?Left rotation 50% limited no pain change   ? (Blank rows = not tested) ?  ?  ?  ?           LE Measurements ?      ?Lower Extremity Right ?12/01/2021 Left ?12/01/2021  ?  A/PROM MMT A/PROM MMT   ?Hip Flexion WNL 4+ WNL 4+  ?Hip Extension WNL 4^ WNL 4+  ?Hip Abduction          ?Hip Adduction          ?Hip Internal rotation WNL*   WNL*    ?Hip External rotation WNL   WNL    ?Knee Flexion   4   4  ?Knee Extension WNL 5 WNL 5  ?Ankle Dorsiflexion   5   5  ?Ankle Plantarflexion          ?Ankle Inversion          ?Ankle Eversion          ? (Blank rows = not tested) ?           * pain and guarding in right PSIS ?           ^lumbar extension and right rotation to achieve movement ?  ?  ?LUMBAR SPECIAL TESTS:  ?Straight leg raise test: Negative, Slump test: negative, and FABER test: Negative ?  ?  ?  ?  ?  ?  ?TODAY'S TREATMENT  ?12/08/2021 ?Therapeutic Exercise: ?   Aerobic: ?   Supine: LTR on ball 2 minutes, DKC on ball 2 minutes, traction over bed 2 minutes - felt good x2 reps ?   Quadruped: cat/cow x10 each, bridge pressing throughout ground x20 ?Standing: traction at counter - difficult for patient to perform ?Neuromuscular Re-education:tactile cues  arm raises raises in quadruped - x20 bilateral ( more difficult on right side) hip extension prone with tactile cues for reduced lumbar extension 3x5 bialteral, standing hip extension x10 bilateral ?Manual Therapy: traction on ball - no change in symptoms.  ?Therapeutic Activity: ?Self  Care: ?Trigger Point Dry Needling:  ?Modalities:  ?  ?Previous Interventions:  ?   Seated: lumbar traction x5 15" holds ? Prone:on elbows x5 10" holds ?  ?  ?PATIENT EDUCATION:  ?Education details: rationale for traction exercises and how to mimic it at home ?Person educated: Patient ?Education method: Explanation, Demonstration, and Handouts ?Education comprehension: verbalized understanding ?  ?  ?  ?HOME EXERCISE PROGRAM: ?[id number] ?  ?ASSESSMENT: ?  ?CLINICAL IMPRESSION: ?12/08/2021 ?Progressed exercises on this date and trailed different traction techniques. Tolerated supine traction over table well but patient reported no similar height bed at home to perform. Focused on  neuromuscular re-education and patient tolerated this well. Compensates in lumbar spine with hip extension motion, but this is improved with TRA activation and tactile cues. Reduced symptoms noted end of session. Will continue with current POC as tolerated.  ? ?Eval: Patient is a 51 y.o. female who was seen today for physical therapy evaluation and treatment for intermittent low back pain. Patient with minimal pain on this date but with fear avoidance into loaded lumbar extension, right rotation and right side bending. Patient with overall good strength but limitations in above motions and pain with these motions. Answered all questions and patient would benefit from skilled PT at this time to improve overall function and quality of life and to prevent re-injury to lumbar spine.  ?  ?  ?OBJECTIVE IMPAIRMENTS decreased activity tolerance, decreased ROM, decreased strength, impaired flexibility, improper body mechanics, postural dysfunction, and pain.  ?  ?ACTIVITY LIMITATIONS  working out .  ?  ?PERSONAL FACTORS Age, Past/current experiences, and Time since onset of injury/illness/exacerbation are also affecting patient's functional outcome.  ?  ?  ?REHAB POTENTIAL: Good ?  ?CLINICAL DECISION MAKING: Stable/uncomplicated ?  ?EVALUATION COMPLEXITY: Low ?  ?  ?GOALS: ?Goals reviewed with patient?  yes ?  ?SHORT TERM GOALS: ?  ?Patient will be independent in self management strategies to improve quality of life and functional outcomes. ?Baseline: new program ?Target date: 12/29/2021 ?Goal status: INITIAL ?  ?2.  Patient will report at least 50% improvement in overall symptoms and/or function to demonstrate improved functional mobility ?Baseline: 0% ?Target date: 12/29/2021 ?Goal status: INITIAL ?  ?3.  Patient will be able to demonstrate at least 50% ROM in lumbar spine in all directions to demonstrate improved gross lumbar mobility ?Target date: 12/29/2021 ?Goal status: INITIAL ?  ?  ?  ?  ?LONG TERM GOALS: ?Patient will  report at least 75% improvement in overall symptoms and/or function to demonstrate improved functional mobility ?Baseline: 0% ?Target date: 01/26/2022 ?Goal status: INITIAL ?  ?2.  Patient will be able to demonst

## 2021-12-08 ENCOUNTER — Encounter: Payer: Self-pay | Admitting: Physical Therapy

## 2021-12-08 ENCOUNTER — Ambulatory Visit: Payer: BC Managed Care – PPO | Admitting: Physical Therapy

## 2021-12-08 DIAGNOSIS — G8929 Other chronic pain: Secondary | ICD-10-CM

## 2021-12-08 DIAGNOSIS — M549 Dorsalgia, unspecified: Secondary | ICD-10-CM

## 2021-12-08 DIAGNOSIS — M6281 Muscle weakness (generalized): Secondary | ICD-10-CM | POA: Diagnosis not present

## 2021-12-15 ENCOUNTER — Encounter: Payer: Self-pay | Admitting: Physical Therapy

## 2021-12-15 ENCOUNTER — Ambulatory Visit: Payer: BC Managed Care – PPO | Admitting: Physical Therapy

## 2021-12-15 DIAGNOSIS — G8929 Other chronic pain: Secondary | ICD-10-CM | POA: Diagnosis not present

## 2021-12-15 DIAGNOSIS — M549 Dorsalgia, unspecified: Secondary | ICD-10-CM

## 2021-12-15 DIAGNOSIS — M6281 Muscle weakness (generalized): Secondary | ICD-10-CM | POA: Diagnosis not present

## 2021-12-15 NOTE — Therapy (Signed)
?OUTPATIENT PHYSICAL THERAPY TREATMENT NOTE ? ? ?Patient Name: Tiffany Knox ?MRN: [id number] ?DOB:11/20/1970, 51 y.o., female ?Today's Date: 12/15/2021 ? ?PCP: Willow OraAndy, Camille L, MD ?REFERRING PROVIDER: Willow OraAndy, Camille L, MD ? ? PT End of Session - 12/15/21 0801   ? ? Visit Number 3   ? Number of Visits 12   ? Date for PT Re-Evaluation 01/26/22   ? Authorization Type 40 VL   ? Authorization - Visit Number 3   ? Authorization - Number of Visits 40   ? PT Start Time 0802   ? PT Stop Time 0841   ? PT Time Calculation (min) 39 min   ? Activity Tolerance Patient tolerated treatment well   ? Behavior During Therapy [facility] for tasks assessed/performed   ? ?  ?  ? ?  ? ? ?Past Medical History:  ?Diagnosis Date  ? Allergy   ? Depression   ? Heart murmur   ? Major depression, recurrent, chronic (HCC) 06/15/2018  ? Migraines   ? ?History reviewed. No pertinent surgical history. ?Patient Active Problem List  ? Diagnosis Date Noted  ? 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 History of migraine 06/15/2018  ? Major depression, recurrent, chronic (HCC) 06/15/2018  ? ? ?PCP: Willow OraAndy, Camille L, MD ?  ?REFERRING PROVIDER: Willow OraAndy, Camille L, MD ?  ?REFERRING DIAG: M54.9,G89.29 (ICD-10-CM) - Chronic back pain, unspecified back location, unspecified back pain laterality ?  ?THERAPY DIAG:  ?Chronic back pain, unspecified back location, unspecified back pain laterality ?  ?Muscle weakness (generalized) ?  ?ONSET DATE: 2 years ago ?  ?SUBJECTIVE:                                                                                                                                                                                          ?  ?SUBJECTIVE STATEMENT: ?12/15/2021 ?No real pain. Reports poor compliance with HEP. ? ?Eval: States that she hurt her back in may 2021. States she had an MRI and she had a couple of breaks in her spine and they think she was born with those breaks. States that she hurt herself working  out and she does have pain. States that [place] in [place]. States while she went to PT then and that helped. States it used to be so bad she couldn't walk ?States she works out and uses free [employer] and follows a program. States when she hurt herself she had dumbbells  and she had to lift them and push them out with rotation. States she had immediate pain and it went into her right leg  and she could barely walk. States that traction felt great while she was in PT. ?  ?PERTINENT HISTORY:  ?left tibial fracture - stress ?  ?PAIN:  ?Are you having pain? no: NPRS scale: 0/10 ?Pain location: right lower back ?Pain description: soreness ?Aggravating factors: run a couple miles 3x a week.  ?Relieving factors: traction, lumbar flexion ?  ?  ?PRECAUTIONS: None ?  ?WEIGHT BEARING RESTRICTIONS No ?  ?FALLS:  ?Has patient fallen in last 6 months? No, Number of falls: 0 ?  ?  ?OCCUPATION: marketing - sits a lot ?  ?PLOF: Independent ?  ?PATIENT GOALS reminding her of what she should be doing to prevent additional injury ?  ?  ?OBJECTIVE:  ?  ?DIAGNOSTIC FINDINGS:  ?Previous imaging but none on file ?   ?PALPATION: ?Tenderness to palpation along right lumbar paraspinals. Increased resting tone throughout right lumbar and glutes. No pain with spring testing of lumbar spine but hypomobility noted ?  ?LUMBAR ROM:  ?  ?Active  A/PROM  ?12/01/2021  ?Flexion 25% limited felt good  ?Extension 100% limited pain in right side low back  ?Right lateral flexion 75% limited pain in right low back  ?Left lateral flexion No pain to left 50% limited  ?Right rotation 100% limited pain on right side  ?Left rotation 50% limited no pain change   ? (Blank rows = not tested) ?  ?  ?  ?           LE Measurements ?      ?Lower Extremity Right ?12/01/2021 Left ?12/01/2021  ?  A/PROM MMT A/PROM MMT  ?Hip Flexion WNL 4+ WNL 4+  ?Hip Extension WNL 4^ WNL 4+  ?Hip Abduction          ?Hip Adduction          ?Hip Internal rotation WNL*    WNL*    ?Hip External rotation WNL   WNL    ?Knee Flexion   4   4  ?Knee Extension WNL 5 WNL 5  ?Ankle Dorsiflexion   5   5  ?Ankle Plantarflexion          ?Ankle Inversion          ?Ankle Eversion          ? (Blank rows = not tested) ?           * pain and guarding in right PSIS ?           ^lumbar extension and right rotation to achieve movement ?  ?  ?LUMBAR SPECIAL TESTS:  ?Straight leg raise test: Negative, Slump test: negative, and FABER test: Negative ?  ?  ?  ?TODAY'S TREATMENT  ?12/15/2021 ?Therapeutic Exercise: ?   Aerobic: ?   Supine:  ?   Quadruped: cat/cow x10 each, bridge pressing throughout ground x20, hip IR stretch 2 minutes, Child's pose over pressure x10 10" holds  ? ? ?Neuromuscular Re-education:palof press red band 2x10 bilateral, overhead squat with tactile and verbal cues -x25, standing hip extension with TRA activation x25 bilateral standing ? ?Manual Therapy: traction  with bed sheets 10 minutes ?Therapeutic Activity: ?Self Care: ?Trigger Point Dry Needling:  ?Modalities:  ?  ?Previous Interventions:  ?   Seated: lumbar traction x5 15" holds ? Prone:on elbows x5 10" holds ?  ?  ?PATIENT EDUCATION:  ?Education details: importance of HEP adherence ?Person educated: Patient ?Education method: Explanation, Demonstration, and Handouts ?Education comprehension: verbalized understanding ?  ?  ?  ?  HOME EXERCISE PROGRAM: ?[id number] ?  ?ASSESSMENT: ?  ?CLINICAL IMPRESSION: ?12/15/2021 ?Continued to progress exercises as tolerated. Trailed traction with bed sheets to lumbar spine and this was tolerated well. Wrote out for home adherence. Added core activation exercises with anti-rotational movement. No pain noted end of session. Fatigue in legs and core noted end of session. ? ?Eval: Patient is a 51 y.o. female who was seen today for physical therapy evaluation and treatment for intermittent low back pain. Patient with minimal pain on this date but with fear avoidance into loaded lumbar extension, right  rotation and right side bending. Patient with overall good strength but limitations in above motions and pain with these motions. Answered all questions and patient would benefit from skilled PT at this time to improve overall function and quality of life and to prevent re-injury to lumbar spine.  ?  ?  ?OBJECTIVE IMPAIRMENTS decreased activity tolerance, decreased ROM, decreased strength, impaired flexibility, improper body mechanics, postural dysfunction, and pain.  ?  ?ACTIVITY LIMITATIONS  working out .  ?  ?PERSONAL FACTORS Age, Past/current experiences, and Time since onset of injury/illness/exacerbation are also affecting patient's functional outcome.  ?  ?  ?REHAB POTENTIAL: Good ?  ?CLINICAL DECISION MAKING: Stable/uncomplicated ?  ?EVALUATION COMPLEXITY: Low ?  ?  ?GOALS: ?Goals reviewed with patient?  yes ?  ?SHORT TERM GOALS: ?  ?Patient will be independent in self management strategies to improve quality of life and functional outcomes. ?Baseline: new program ?Target date: 12/29/2021 ?Goal status: INITIAL ?  ?2.  Patient will report at least 50% improvement in overall symptoms and/or function to demonstrate improved functional mobility ?Baseline: 0% ?Target date: 12/29/2021 ?Goal status: INITIAL ?  ?3.  Patient will be able to demonstrate at least 50% ROM in lumbar spine in all directions to demonstrate improved gross lumbar mobility ?Target date: 12/29/2021 ?Goal status: INITIAL ?  ?  ?  ?  ?LONG TERM GOALS: ?Patient will report at least 75% improvement in overall symptoms and/or function to demonstrate improved functional mobility ?Baseline: 0% ?Target date: 01/26/2022 ?Goal status: INITIAL ?  ?2.  Patient will be able to demonstrate squats and lifts with good form to reduce risk of re-injury ?Target date: 01/26/2022 ?Goal status: INITIAL ?  ?3.  Patient will be able to demonstrate loaded lumbar extension and Rotation movements without pain or compensation to return to PLOF ?Target date: 01/26/2022 ?  ?  ?  ?   ?  ?PLAN: ?PT FREQUENCY: 1-2x/week for total of 12 visits over 8 week certifiation ?  ?PT DURATION: 8 weeks ?  ?PLANNED INTERVENTIONS: Therapeutic exercises, Therapeutic activity, Neuromuscular re-educati

## 2021-12-22 ENCOUNTER — Encounter: Payer: BC Managed Care – PPO | Admitting: Physical Therapy

## 2021-12-23 ENCOUNTER — Ambulatory Visit: Payer: BC Managed Care – PPO | Admitting: Physical Therapy

## 2021-12-23 ENCOUNTER — Encounter: Payer: Self-pay | Admitting: Physical Therapy

## 2021-12-23 DIAGNOSIS — M6281 Muscle weakness (generalized): Secondary | ICD-10-CM | POA: Diagnosis not present

## 2021-12-23 DIAGNOSIS — G8929 Other chronic pain: Secondary | ICD-10-CM | POA: Diagnosis not present

## 2021-12-23 DIAGNOSIS — M549 Dorsalgia, unspecified: Secondary | ICD-10-CM

## 2021-12-23 DIAGNOSIS — F4320 Adjustment disorder, unspecified: Secondary | ICD-10-CM | POA: Diagnosis not present

## 2021-12-23 NOTE — Therapy (Signed)
?OUTPATIENT PHYSICAL THERAPY TREATMENT NOTE ? ? ?Patient Name: Tiffany Knox ?MRN: [id number] ?DOB:[birth year], 51 y.o., female ?Today's Date: 12/23/2021 ? ?PCP: Willow Ora, MD ?REFERRING PROVIDER: Willow Ora, MD ? ? PT End of Session - 12/23/21 0809   ? ? Visit Number 4   ? Number of Visits 12   ? Date for PT Re-Evaluation 01/26/22   ? Authorization Type 40 VL   ? Authorization - Visit Number 4   ? Authorization - Number of Visits 40   ? PT Start Time 0809   late to session  ? PT Stop Time 0847   ? PT Time Calculation (min) 38 min   ? Activity Tolerance Patient tolerated treatment well   ? Behavior During Therapy [facility] for tasks assessed/performed   ? ?  ?  ? ?  ? ? ?Past Medical History:  ?Diagnosis Date  ? Allergy   ? Depression   ? Heart murmur   ? Major depression, recurrent, chronic (HCC) 06/15/2018  ? Migraines   ? ?History reviewed. No pertinent surgical history. ?Patient Active Problem List  ? Diagnosis Date Noted  ? 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 History of migraine 06/15/2018  ? Major depression, recurrent, chronic (HCC) 06/15/2018  ? ? ?PCP: Willow Ora, MD ?  ?REFERRING PROVIDER: Willow Ora, MD ?  ?REFERRING DIAG: M54.9,G89.29 (ICD-10-CM) - Chronic back pain, unspecified back location, unspecified back pain laterality ?  ?THERAPY DIAG:  ?Chronic back pain, unspecified back location, unspecified back pain laterality ?  ?Muscle weakness (generalized) ?  ?ONSET DATE: 2 years ago ?  ?SUBJECTIVE:                                                                                                                                                                                          ?  ?SUBJECTIVE STATEMENT: ?12/23/2021 ?States that she feels good no pain and has been doing her traction and that helps. ? ?Eval: States that she hurt her back in may 2021. States she had an MRI and she had a couple of breaks in her spine and they think she was born with  those breaks. States that she hurt herself working out and she does have pain. States that she had imaging in [place] and moved to [place]. States while she went to PT then and that helped. States it used to be so bad she couldn't walk ?States she works out and uses free [employer] and follows a program. States when she hurt herself she had dumbbells  and she had to lift them and push them out with rotation.  States she had immediate pain and it went into her right leg and she could barely walk. States that traction felt great while she was in PT. ?  ?PERTINENT HISTORY:  ?left tibial fracture - stress ?  ?PAIN:  ?Are you having pain? no: NPRS scale: 0/10 ?Pain location: right lower back ?Pain description: soreness ?Aggravating factors: run a couple miles 3x a week.  ?Relieving factors: traction, lumbar flexion ?  ?  ?PRECAUTIONS: None ?  ?WEIGHT BEARING RESTRICTIONS No ?  ?FALLS:  ?Has patient fallen in last 6 months? No, Number of falls: 0 ?  ?  ?OCCUPATION: marketing - sits a lot ?  ?PLOF: Independent ?  ?PATIENT GOALS reminding her of what she should be doing to prevent additional injury ?  ?  ?OBJECTIVE:  ?  ?DIAGNOSTIC FINDINGS:  ?Previous imaging but none on file ?   ?PALPATION: ?Tenderness to palpation along right lumbar paraspinals. Increased resting tone throughout right lumbar and glutes. No pain with spring testing of lumbar spine but hypomobility noted ?  ?LUMBAR ROM:  ?  ?Active  A/PROM  ?12/01/2021  ?Flexion 25% limited felt good  ?Extension 100% limited pain in right side low back  ?Right lateral flexion 75% limited pain in right low back  ?Left lateral flexion No pain to left 50% limited  ?Right rotation 100% limited pain on right side  ?Left rotation 50% limited no pain change   ? (Blank rows = not tested) ?  ?  ?  ?           LE Measurements ?      ?Lower Extremity Right ?12/01/2021 Left ?12/01/2021  ?  A/PROM MMT A/PROM MMT  ?Hip Flexion WNL 4+ WNL 4+  ?Hip Extension WNL 4^ WNL 4+  ?Hip Abduction          ?Hip  Adduction          ?Hip Internal rotation WNL*   WNL*    ?Hip External rotation WNL   WNL    ?Knee Flexion   4   4  ?Knee Extension WNL 5 WNL 5  ?Ankle Dorsiflexion   5   5  ?Ankle Plantarflexion          ?Ankle Inversion          ?Ankle Eversion          ? (Blank rows = not tested) ?           * pain and guarding in right PSIS ?           ^lumbar extension and right rotation to achieve movement ?  ?  ?LUMBAR SPECIAL TESTS:  ?Straight leg raise test: Negative, Slump test: negative, and FABER test: Negative ?  ?  ?  ?TODAY'S TREATMENT  ?12/23/2021 ?Therapeutic Exercise: ?   Supine: self traction with dowel - not tolerated well stopped, self traction with mobilization belt 5 minutes, LTR x20 bilateral, bridge walkouts 3x3 slow and controlled, hamstring stretch bilateral with strap with DF/PF and knee engaged x15, quad stretch edge of table ? ?Neuromuscular Re-education: hip hinge reach with deadlift combo 10 minutes total with verbal and tactile cues, quad plank 3x5 5" holds - verbal and tactile cues, squat form with dowel x15 ? ?Manual Therapy:  ?Therapeutic Activity: ?Self Care: ?Trigger Point Dry Needling:  ?Modalities:  ?  ?Previous Interventions:  ?   Seated: lumbar traction x5 15" holds ? Prone:on elbows x5 10" holds ?  ?  ?PATIENT EDUCATION:  ?Education  details: on traction, on mobilization belt ?Person educated: Patient ?Education method: Explanation, Demonstration, and Handouts ?Education comprehension: verbalized understanding ?  ?  ?  ?HOME EXERCISE PROGRAM: ?[id number] ?  ?ASSESSMENT: ?  ?CLINICAL IMPRESSION: ?12/23/2021 ?Added self mobilization/traction which was tolerated well. Fatigue noted with new exercises but improved form with squats and hip hinges. No pain noted end o session but tactile and verbal cues throughout. Overall patient is doing well and would continue to benefit from skilled PT at this time. ? ?Eval: Patient is a 51 y.o. female who was seen today for physical therapy evaluation and treatment  for intermittent low back pain. Patient with minimal pain on this date but with fear avoidance into loaded lumbar extension, right rotation and right side bending. Patient with overall good strength but limitations in above motions and pain with these motions. Answered all questions and patient would benefit from skilled PT at this time to improve overall function and quality of life and to prevent re-injury to lumbar spine.  ?  ?  ?OBJECTIVE IMPAIRMENTS decreased activity tolerance, decreased ROM, decreased strength, impaired flexibility, improper body mechanics, postural dysfunction, and pain.  ?  ?ACTIVITY LIMITATIONS  working out .  ?  ?PERSONAL FACTORS Age, Past/current experiences, and Time since onset of injury/illness/exacerbation are also affecting patient's functional outcome.  ?  ?  ?REHAB POTENTIAL: Good ?  ?CLINICAL DECISION MAKING: Stable/uncomplicated ?  ?EVALUATION COMPLEXITY: Low ?  ?  ?GOALS: ?Goals reviewed with patient?  yes ?  ?SHORT TERM GOALS: ?  ?Patient will be independent in self management strategies to improve quality of life and functional outcomes. ?Baseline: new program ?Target date: 12/29/2021 ?Goal status: INITIAL ?  ?2.  Patient will report at least 50% improvement in overall symptoms and/or function to demonstrate improved functional mobility ?Baseline: 0% ?Target date: 12/29/2021 ?Goal status: INITIAL ?  ?3.  Patient will be able to demonstrate at least 50% ROM in lumbar spine in all directions to demonstrate improved gross lumbar mobility ?Target date: 12/29/2021 ?Goal status: INITIAL ?  ?  ?  ?  ?LONG TERM GOALS: ?Patient will report at least 75% improvement in overall symptoms and/or function to demonstrate improved functional mobility ?Baseline: 0% ?Target date: 01/26/2022 ?Goal status: INITIAL ?  ?2.  Patient will be able to demonstrate squats and lifts with good form to reduce risk of re-injury ?Target date: 01/26/2022 ?Goal status: INITIAL ?  ?3.  Patient will be able to  demonstrate loaded lumbar extension and Rotation movements without pain or compensation to return to PLOF ?Target date: 01/26/2022 ?  ?  ?  ?  ?  ?PLAN: ?PT FREQUENCY: 1-2x/week for total of 12 visits over 8 week

## 2021-12-29 ENCOUNTER — Encounter: Payer: BC Managed Care – PPO | Admitting: Physical Therapy

## 2022-01-05 ENCOUNTER — Encounter: Payer: Self-pay | Admitting: Physical Therapy

## 2022-01-05 ENCOUNTER — Ambulatory Visit: Payer: BC Managed Care – PPO | Admitting: Physical Therapy

## 2022-01-05 DIAGNOSIS — G8929 Other chronic pain: Secondary | ICD-10-CM | POA: Diagnosis not present

## 2022-01-05 DIAGNOSIS — M6281 Muscle weakness (generalized): Secondary | ICD-10-CM | POA: Diagnosis not present

## 2022-01-05 DIAGNOSIS — M549 Dorsalgia, unspecified: Secondary | ICD-10-CM | POA: Diagnosis not present

## 2022-01-05 NOTE — Therapy (Signed)
?OUTPATIENT PHYSICAL THERAPY TREATMENT NOTE ? ? ?Patient Name: Tiffany Knox ?MRN: [id number] ?DOB:07/16/1971, 51 y.o., female ?Today's Date: 01/05/2022 ? ?PCP: Willow OraAndy, Camille L, MD ?REFERRING PROVIDER: Willow OraAndy, Camille L, MD ? ? PT End of Session - 01/05/22 0806   ? ? Visit Number 5   ? Number of Visits 12   ? Date for PT Re-Evaluation 01/26/22   ? Authorization Type 40 VL   ? Authorization - Visit Number 5   ? Authorization - Number of Visits 40   ? PT Start Time [id number]   ? PT Stop Time 0844   ? PT Time Calculation (min) 38 min   ? Activity Tolerance Patient tolerated treatment well   ? [facility]/performed   ? ?  ?  ? ?  ? ? ?Past Medical History:  ?Diagnosis Date  ? Allergy   ? Depression   ? Heart murmur   ? Major depression, recurrent, chronic (HCC) 06/15/2018  ? Migraines   ? ?History reviewed. No pertinent surgical history. ?Patient Active Problem List  ? Diagnosis Date Noted  ? 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 History of migraine 06/15/2018  ? Major depression, recurrent, chronic (HCC) 06/15/2018  ? ? ?PCP: Willow OraAndy, Camille L, MD ?  ?REFERRING PROVIDER: Willow OraAndy, Camille L, MD ?  ?REFERRING DIAG: M54.9,G89.29 (ICD-10-CM) - Chronic back pain, unspecified back location, unspecified back pain laterality ?  ?THERAPY DIAG:  ?Chronic back pain, unspecified back location, unspecified back pain laterality ?  ?Muscle weakness (generalized) ?  ?ONSET DATE: 2 years ago ?  ?SUBJECTIVE:                                                                                                                                                                                          ?  ?SUBJECTIVE STATEMENT: ?01/05/2022 ?States that she was picking up a cat food dish last week and threw her back out and couldn't exercise. States that she tried the traction and it helped. States that she feels better but it is present.  ? ?Eval: States that she hurt her back in may  2021. States she had an MRI and she had a couple of breaks in her spine and they think she was born with those breaks. States that she hurt herself working out and she does have pain. [place] that [place] States while she went to PT then and that helped. States it used to be so bad she couldn't walk ?States she works out and uses free [employer] and follows a Investment banker, corporateprogram. States when  she hurt herself she had dumbbells  and she had to lift them and push them out with rotation. States she had immediate pain and it went into her right leg and she could barely walk. States that traction felt great while she was in PT. ?  ?PERTINENT HISTORY:  ?left tibial fracture - stress ?  ?PAIN:  ?Are you having pain? : NPRS scale: 2/10 ?Pain location: across lower back ?Pain description: soreness ?Aggravating factors: run a couple miles 3x a week.  ?Relieving factors: traction, lumbar flexion ?  ?  ?PRECAUTIONS: None ?  ?WEIGHT BEARING RESTRICTIONS No ?  ?FALLS:  ?Has patient fallen in last 6 months? No, Number of falls: 0 ?  ?  ?OCCUPATION: marketing - sits a lot ?  ?PLOF: Independent ?  ?PATIENT GOALS reminding her of what she should be doing to prevent additional injury ?  ?  ?OBJECTIVE:  ?  ?DIAGNOSTIC FINDINGS:  ?Previous imaging but none on file ?   ?PALPATION: ?Tenderness to palpation along right lumbar paraspinals. Increased resting tone throughout right lumbar and glutes. No pain with spring testing of lumbar spine but hypomobility noted ?  ?LUMBAR ROM:  ?  ?Active  A/PROM  ?12/01/2021  ?Flexion 25% limited felt good  ?Extension 100% limited pain in right side low back  ?Right lateral flexion 75% limited pain in right low back  ?Left lateral flexion No pain to left 50% limited  ?Right rotation 100% limited pain on right side  ?Left rotation 50% limited no pain change   ? (Blank rows = not tested) ?  ?  ?  ?           LE Measurements ?      ?Lower Extremity Right ?12/01/2021 Left ?12/01/2021  ?  A/PROM  MMT A/PROM MMT  ?Hip Flexion WNL 4+ WNL 4+  ?Hip Extension WNL 4^ WNL 4+  ?Hip Abduction          ?Hip Adduction          ?Hip Internal rotation WNL*   WNL*    ?Hip External rotation WNL   WNL    ?Knee Flexion   4   4  ?Knee Extension WNL 5 WNL 5  ?Ankle Dorsiflexion   5   5  ?Ankle Plantarflexion          ?Ankle Inversion          ?Ankle Eversion          ? (Blank rows = not tested) ?           * pain and guarding in right PSIS ?           ^lumbar extension and right rotation to achieve movement ?  ?  ?LUMBAR SPECIAL TESTS:  ?Straight leg raise test: Negative, Slump test: negative, and FABER test: Negative ?  ?  ?  ?TODAY'S TREATMENT  ?01/05/2022 ?Therapeutic Exercise: ?Standing: lumbar extension at wall 2 minutes, glute contraction ?   Supine: self traction with belt - not tolerated, LTR on ball 3 minutes, DKC 2 minutes on ball ?Prone lying 2 minutes, then POE 2 minutes up/down, prone press ups 2 minutes, prone heel press 4 minutes ?Manual: Traction - lumbar no change symptoms  ?Lumbar support in seated position  ? ? ?Manual Therapy:  ?Therapeutic Activity: ?Self Care: ?Trigger Point Dry Needling:  ?Modalities:  ?  ?Previous Interventions:  ?   Seated: lumbar traction x5 15" holds ? Prone:on elbows x5 10" holds ?  ?  ?  PATIENT EDUCATION:  ?Education details: on 90/90 relief position and different traction positions for pain relief ?Person educated: Patient ?Education method: Explanation, Demonstration, and Handouts ?Education comprehension: verbalized understanding ?  ?  ?  ?HOME EXERCISE PROGRAM: ?[id number] ?  ?ASSESSMENT: ?  ?CLINICAL IMPRESSION: ?01/05/2022 ?Recent exacerbation with low back pain last week. Did not tolerate traction well but did tolerate decompressive positions. Patient also tolerated lumbar extension and glute activation. Educated on lumbar support and glute activation throughout the day. No pain noted end of session. Added all new exercises to HEP.  ? ?Eval: Patient is a 51 y.o. female who was  seen today for physical therapy evaluation and treatment for intermittent low back pain. Patient with minimal pain on this date but with fear avoidance into loaded lumbar extension, right rotation and right side bending. Patient with overall good strength but limitations in above motions and pain with these motions. Answered all questions and patient would benefit from skilled PT at this time to improve overall function and quality of life and to prevent re-injury to lumbar spine.  ?  ?  ?OBJECTIVE IMPAIRMENTS decreased activity tolerance, decreased ROM, decreased strength, impaired flexibility, improper body mechanics, postural dysfunction, and pain.  ?  ?ACTIVITY LIMITATIONS  working out .  ?  ?PERSONAL FACTORS Age, Past/current experiences, and Time since onset of injury/illness/exacerbation are also affecting patient's functional outcome.  ?  ?  ?REHAB POTENTIAL: Good ?  ?CLINICAL DECISION MAKING: Stable/uncomplicated ?  ?EVALUATION COMPLEXITY: Low ?  ?  ?GOALS: ?Goals reviewed with patient?  yes ?  ?SHORT TERM GOALS: ?  ?Patient will be independent in self management strategies to improve quality of life and functional outcomes. ?Baseline: new program ?Target date: 12/29/2021 ?Goal status: INITIAL ?  ?2.  Patient will report at least 50% improvement in overall symptoms and/or function to demonstrate improved functional mobility ?Baseline: 0% ?Target date: 12/29/2021 ?Goal status: INITIAL ?  ?3.  Patient will be able to demonstrate at least 50% ROM in lumbar spine in all directions to demonstrate improved gross lumbar mobility ?Target date: 12/29/2021 ?Goal status: INITIAL ?  ?  ?  ?  ?LONG TERM GOALS: ?Patient will report at least 75% improvement in overall symptoms and/or function to demonstrate improved functional mobility ?Baseline: 0% ?Target date: 01/26/2022 ?Goal status: INITIAL ?  ?2.  Patient will be able to demonstrate squats and lifts with good form to reduce risk of re-injury ?Target date: 01/26/2022 ?Goal  status: INITIAL ?  ?3.  Patient will be able to demonstrate loaded lumbar extension and Rotation movements without pain or compensation to return to PLOF ?Target date: 01/26/2022 ?  ?  ?  ?  ?  ?PLAN: ?PT FR

## 2022-01-06 DIAGNOSIS — F4320 Adjustment disorder, unspecified: Secondary | ICD-10-CM | POA: Diagnosis not present

## 2022-01-12 ENCOUNTER — Ambulatory Visit: Payer: BC Managed Care – PPO | Admitting: Physical Therapy

## 2022-01-12 ENCOUNTER — Encounter: Payer: Self-pay | Admitting: Physical Therapy

## 2022-01-12 DIAGNOSIS — M549 Dorsalgia, unspecified: Secondary | ICD-10-CM | POA: Diagnosis not present

## 2022-01-12 DIAGNOSIS — M6281 Muscle weakness (generalized): Secondary | ICD-10-CM | POA: Diagnosis not present

## 2022-01-12 DIAGNOSIS — G8929 Other chronic pain: Secondary | ICD-10-CM

## 2022-01-12 NOTE — Therapy (Signed)
?OUTPATIENT PHYSICAL THERAPY TREATMENT NOTE ? ? ?Patient Name: Tiffany Knox ?MRN: [id number] ?DOB:Aug 17, 1971, 51 y.o., female ?Today's Date: 01/12/2022 ? ?PCP: Willow Ora, MD ?REFERRING PROVIDER: Willow Ora, MD ? ? PT End of Session - 01/12/22 9924   ? ? Visit Number 6   ? Number of Visits 12   ? Date for PT Re-Evaluation 01/26/22   ? Authorization Type 40 VL   ? Authorization - Visit Number 6   ? Authorization - Number of Visits 40   ? PT Start Time 0815   late to apt  ? PT Stop Time 0840   ? PT Time Calculation (min) 25 min   ? Activity Tolerance Patient tolerated treatment well   ? Behavior During Therapy [facility] for tasks assessed/performed   ? ?  ?  ? ?  ? ? ?Past Medical History:  ?Diagnosis Date  ? Allergy   ? Depression   ? Heart murmur   ? Major depression, recurrent, chronic (HCC) 06/15/2018  ? Migraines   ? ?History reviewed. No pertinent surgical history. ?Patient Active Problem List  ? Diagnosis Date Noted  ? 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 History of migraine 06/15/2018  ? Major depression, recurrent, chronic (HCC) 06/15/2018  ? ? ?PCP: Willow Ora, MD ?  ?REFERRING PROVIDER: Willow Ora, MD ?  ?REFERRING DIAG: M54.9,G89.29 (ICD-10-CM) - Chronic back pain, unspecified back location, unspecified back pain laterality ?  ?THERAPY DIAG:  ?Chronic back pain, unspecified back location, unspecified back pain laterality ?  ?Muscle weakness (generalized) ?  ?ONSET DATE: 2 years ago ?  ?SUBJECTIVE:                                                                                                                                                                                          ?  ?SUBJECTIVE STATEMENT: ?01/12/2022 ?States she feels about 5/10 stiffness. ? ?Eval: States that she hurt her back in may 2021. States she had an MRI and she had a couple of breaks in her spine and they think she was born with those breaks. States that she hurt herself  working out and she does have pain. States that she had imaging in [place] and moved to [place]. States while she went to PT then and that helped. States it used to be so bad she couldn't walk ?States she works out and uses free [employer] and follows a program. States when she hurt herself she had dumbbells  and she had to lift them and push them out with rotation. States she had immediate pain and it went into her  right leg and she could barely walk. States that traction felt great while she was in PT. ?  ?PERTINENT HISTORY:  ?left tibial fracture - stress ?  ?PAIN:  ?Are you having pain? : NPRS scale: 2/10 ?Pain location: across lower back ?Pain description: soreness ?Aggravating factors: run a couple miles 3x a week.  ?Relieving factors: traction, lumbar flexion ?  ?  ?PRECAUTIONS: None ?  ?WEIGHT BEARING RESTRICTIONS No ?  ?FALLS:  ?Has patient fallen in last 6 months? No, Number of falls: 0 ?  ?  ?OCCUPATION: marketing - sits a lot ?  ?PLOF: Independent ?  ?PATIENT GOALS reminding her of what she should be doing to prevent additional injury ?  ?  ?OBJECTIVE:  ?  ?DIAGNOSTIC FINDINGS:  ?Previous imaging but none on file ?   ?PALPATION: ?Tenderness to palpation along right lumbar paraspinals. Increased resting tone throughout right lumbar and glutes. No pain with spring testing of lumbar spine but hypomobility noted ?  ?LUMBAR ROM:  ?  ?Active  A/PROM  ?12/01/2021  ?Flexion 25% limited felt good  ?Extension 100% limited pain in right side low back  ?Right lateral flexion 75% limited pain in right low back  ?Left lateral flexion No pain to left 50% limited  ?Right rotation 100% limited pain on right side  ?Left rotation 50% limited no pain change   ? (Blank rows = not tested) ?  ?  ?  ?           LE Measurements ?      ?Lower Extremity Right ?12/01/2021 Left ?12/01/2021  ?  A/PROM MMT A/PROM MMT  ?Hip Flexion WNL 4+ WNL 4+  ?Hip Extension WNL 4^ WNL 4+  ?Hip Abduction          ?Hip Adduction          ?Hip Internal rotation  WNL*   WNL*    ?Hip External rotation WNL   WNL    ?Knee Flexion   4   4  ?Knee Extension WNL 5 WNL 5  ?Ankle Dorsiflexion   5   5  ?Ankle Plantarflexion          ?Ankle Inversion          ?Ankle Eversion          ? (Blank rows = not tested) ?           * pain and guarding in right PSIS ?           ^lumbar extension and right rotation to achieve movement ?  ?  ?LUMBAR SPECIAL TESTS:  ?Straight leg raise test: Negative, Slump test: negative, and FABER test: Negative ?  ?  ?  ?TODAY'S TREATMENT  ?01/12/2022 ?Therapeutic Exercise ?   Supine: lumbar towel roll x2 2 minutes in low back, bridge with black band 5 pulses at top x3 1 minute each ? ? ?Neuro Re-ed: seated hip hinge 15 minutes total ? ?Therapeutic Activity: ?Self Care: ?Trigger Point Dry Needling:  ?Modalities:  ?  ?Previous Interventions:  ?   Seated: lumbar traction x5 15" holds ? Prone:on elbows x5 10" holds ?  ?  ?PATIENT EDUCATION:  ?Education details: on current HEP ?Person educated: Patient ?Education method: Explanation, Demonstration, and Handouts ?Education comprehension: verbalized understanding ?  ?  ?  ?HOME EXERCISE PROGRAM: ?[id number] ?  ?ASSESSMENT: ?  ?CLINICAL IMPRESSION: ?01/12/2022 ? Session limited secondary to late arrival.  Focused on glute activation and lumbar stretching. Tolerated well with reduced stiffness  noted after lumbar stretch. Fatigue in glutes but no pain. Instructed patient to perform these 3 exercises daily in am. Will follow up with patient next session. ? ?Eval: Patient is a 51 y.o. female who was seen today for physical therapy evaluation and treatment for intermittent low back pain. Patient with minimal pain on this date but with fear avoidance into loaded lumbar extension, right rotation and right side bending. Patient with overall good strength but limitations in above motions and pain with these motions. Answered all questions and patient would benefit from skilled PT at this time to improve overall function and quality  of life and to prevent re-injury to lumbar spine.  ?  ?  ?OBJECTIVE IMPAIRMENTS decreased activity tolerance, decreased ROM, decreased strength, impaired flexibility, improper body mechanics, postural dysfunction, and pain.  ?  ?ACTIVITY LIMITATIONS  working out .  ?  ?PERSONAL FACTORS Age, Past/current experiences, and Time since onset of injury/illness/exacerbation are also affecting patient's functional outcome.  ?  ?  ?REHAB POTENTIAL: Good ?  ?CLINICAL DECISION MAKING: Stable/uncomplicated ?  ?EVALUATION COMPLEXITY: Low ?  ?  ?GOALS: ?Goals reviewed with patient?  yes ?  ?SHORT TERM GOALS: ?  ?Patient will be independent in self management strategies to improve quality of life and functional outcomes. ?Baseline: new program ?Target date: 12/29/2021 ?Goal status: INITIAL ?  ?2.  Patient will report at least 50% improvement in overall symptoms and/or function to demonstrate improved functional mobility ?Baseline: 0% ?Target date: 12/29/2021 ?Goal status: INITIAL ?  ?3.  Patient will be able to demonstrate at least 50% ROM in lumbar spine in all directions to demonstrate improved gross lumbar mobility ?Target date: 12/29/2021 ?Goal status: INITIAL ?  ?  ?  ?  ?LONG TERM GOALS: ?Patient will report at least 75% improvement in overall symptoms and/or function to demonstrate improved functional mobility ?Baseline: 0% ?Target date: 01/26/2022 ?Goal status: INITIAL ?  ?2.  Patient will be able to demonstrate squats and lifts with good form to reduce risk of re-injury ?Target date: 01/26/2022 ?Goal status: INITIAL ?  ?3.  Patient will be able to demonstrate loaded lumbar extension and Rotation movements without pain or compensation to return to PLOF ?Target date: 01/26/2022 ?  ?  ?  ?  ?  ?PLAN: ?PT FREQUENCY: 1-2x/week for total of 12 visits over 8 week certifiation ?  ?PT DURATION: 8 weeks ?  ?PLANNED INTERVENTIONS: Therapeutic exercises, Therapeutic activity, Neuromuscular re-education, Balance training, Gait training,  Patient/Family education, Joint manipulation, Joint mobilization, DME instructions, Aquatic Therapy, Dry Needling, Electrical stimulation, Spinal manipulation, Cryotherapy, Moist heat, and Manual therapy. ?  ?PLAN

## 2022-01-24 ENCOUNTER — Other Ambulatory Visit: Payer: Self-pay | Admitting: Family Medicine

## 2022-01-26 ENCOUNTER — Encounter: Payer: Self-pay | Admitting: Physical Therapy

## 2022-01-26 ENCOUNTER — Ambulatory Visit: Payer: BC Managed Care – PPO | Admitting: Physical Therapy

## 2022-01-26 DIAGNOSIS — G8929 Other chronic pain: Secondary | ICD-10-CM

## 2022-01-26 DIAGNOSIS — M6281 Muscle weakness (generalized): Secondary | ICD-10-CM

## 2022-01-26 DIAGNOSIS — M549 Dorsalgia, unspecified: Secondary | ICD-10-CM

## 2022-01-26 NOTE — Therapy (Signed)
?OUTPATIENT PHYSICAL THERAPY TREATMENT NOTE and Discharge note ? ? ?Patient Name: Tiffany Knox ?MRN: [id number] ?DOB:19-Jul-1971, 51 y.o., female ?Today's Date: 01/26/2022 ? ?PHYSICAL THERAPY DISCHARGE SUMMARY ? ?Visits from Start of Care: 7 ? ?Current functional level related to goals / functional outcomes: ?All goals met ?  ?Remaining deficits: ?none ?  ?Education / Equipment: ?See below  ? ?Patient agrees to discharge. Patient goals were met. Patient is being discharged due to meeting the stated rehab goals. ? ? ?PCP: Leamon Arnt, MD ?REFERRING PROVIDER: Leamon Arnt, MD ? ? PT End of Session - 01/26/22 0803   ? ? Visit Number 7   ? Number of Visits 12   ? Date for PT Re-Evaluation 01/26/22   ? Authorization Type 40 VL   ? Authorization - Visit Number 7   ? Authorization - Number of Visits 40   ? PT Start Time [phone]   ? PT Stop Time 0830   ? PT Time Calculation (min) 26 min   ? Activity Tolerance Patient tolerated treatment well   ? Behavior During Therapy [facility] for tasks assessed/performed   ? ?  ?  ? ?  ? ? ?Past Medical History:  ?Diagnosis Date  ? Allergy   ? Depression   ? Heart murmur   ? Major depression, recurrent, chronic ([place]) 06/15/2018  ? Migraines   ? ?History reviewed. No pertinent surgical history. ?Patient Active Problem List  ? Diagnosis Date Noted  ? 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 History of migraine 06/15/2018  ? Major depression, recurrent, chronic ([place]) 06/15/2018  ? ? ?PCP: Leamon Arnt, MD ?  ?REFERRING PROVIDER: Leamon Arnt, MD ?  ?REFERRING DIAG: M54.9,G89.29 (ICD-10-CM) - Chronic back pain, unspecified back location, unspecified back pain laterality ?  ?THERAPY DIAG:  ?Chronic back pain, unspecified back location, unspecified back pain laterality ?  ?Muscle weakness (generalized) ?  ?ONSET DATE: 2 years ago ?  ?SUBJECTIVE:                                                                                                                                                                                           ?  ?SUBJECTIVE STATEMENT: ?01/26/2022 ?States her back is pretty good and ran a short race this weekend and did well. Reports overall feels 80% better. State that her last 20% is a little bit of an ache on the left side from running.  ? ?Eval: States that she hurt her back in may 2021. States she had an MRI and she had a couple of breaks in her spine and they think  she was born with those breaks. States that she hurt herself working out and she does have pain. States that she had imaging in [place] and moved to [place]. States while she went to PT then and that helped. States it used to be so bad she couldn't walk ?States she works out and uses [employer] and follows a program. States when she hurt herself she had dumbbells  and she had to lift them and push them out with rotation. States she had immediate pain and it went into her right leg and she could barely walk. States that traction felt great while she was in PT. ?  ?PERTINENT HISTORY:  ?left tibial fracture - stress ?  ?PAIN:  ?Are you having pain? : NPRS scale: 1/10 ?Pain location: across lower back ?Pain description: tightness ?Aggravating factors: run a couple miles 3x a week.  ?Relieving factors: traction, lumbar flexion ?  ?  ?PRECAUTIONS: None ?  ?WEIGHT BEARING RESTRICTIONS No ?  ?FALLS:  ?Has patient fallen in last 6 months? No, Number of falls: 0 ?  ?  ?OCCUPATION: marketing - sits a lot ?  ?PLOF: Independent ?  ?PATIENT GOALS reminding her of what she should be doing to prevent additional injury ?  ?  ?OBJECTIVE:  ?  ?DIAGNOSTIC FINDINGS:  ?Previous imaging but none on file ?   ?PALPATION: ?Tenderness to palpation along right lumbar paraspinals. Increased resting tone throughout right lumbar and glutes. No pain with spring testing of lumbar spine but hypomobility noted ?  ?LUMBAR ROM:  ?  ?Active  A/PROM  ?01/26/2022  ?Flexion 25% limited felt good  ?Extension 25% limited feels good   ?Right lateral flexion 25% limited tightness on contralateral side  ?Left lateral flexion 25% limited, tightness on contralateral side  ?Right rotation 25% limited no pain  ?Left rotation 25% limited no pain   ? (Blank rows = not tested) ?  ?  ?  ?           LE Measurements ?      ?Lower Extremity Right ?01/26/2022 Left ?01/26/2022  ?  A/PROM MMT A/PROM MMT  ?Hip Flexion WNL 4+ WNL 4+  ?Hip Extension WNL 4+ WNL 4+  ?Hip Abduction          ?Hip Adduction          ?Hip Internal rotation WNL   WNL    ?Hip External rotation WNL   WNL    ?Knee Flexion   4+   4+  ?Knee Extension WNL 5 WNL 5  ?Ankle Dorsiflexion   5   5  ?Ankle Plantarflexion          ?Ankle Inversion          ?Ankle Eversion          ? ? TODAY'S TREATMENT  ?01/26/2022 ?Therapeutic Exercise ?   kneeling ankle stretch x15 L/R/C bilaterally 5" holds:   ? ? ?  ?PATIENT EDUCATION:  ?Education details: on current HEP, on progress, on incidence of LBP and what to do , on squat form.  ?Person educated: Patient ?Education method: Explanation, Demonstration, and Handouts ?Education comprehension: verbalized understanding ?  ?  ?  ?HOME EXERCISE PROGRAM: ?[id number] ?  ?ASSESSMENT: ?  ?CLINICAL IMPRESSION: ?01/26/2022 ?All goals met at this time. Reviewed squat form and identified ankle restrictions with deeper squat. Educated patient in ankle mobility and stretches. Added this to HEP. Answered all questions prior to end of session. Patient to discharge from PT to HEP  secondary to progress made. ? ?Eval: Patient is a 51 y.o. female who was seen today for physical therapy evaluation and treatment for intermittent low back pain. Patient with minimal pain on this date but with fear avoidance into loaded lumbar extension, right rotation and right side bending. Patient with overall good strength but limitations in above motions and pain with these motions. Answered all questions and patient would benefit from skilled PT at this time to improve overall function and quality of life  and to prevent re-injury to lumbar spine.  ?  ?  ?OBJECTIVE IMPAIRMENTS decreased activity tolerance, decreased ROM, decreased strength, impaired flexibility, improper body mechanics, postural dysfunction, and pain.  ?  ?ACTIVITY LIMITATIONS  working out .  ?  ?PERSONAL FACTORS Age, Past/current experiences, and Time since onset of injury/illness/exacerbation are also affecting patient's functional outcome.  ?  ?  ?REHAB POTENTIAL: Good ?  ?CLINICAL DECISION MAKING: Stable/uncomplicated ?  ?EVALUATION COMPLEXITY: Low ?  ?  ?GOALS: ?Goals reviewed with patient?  yes ?  ?SHORT TERM GOALS: ?  ?Patient will be independent in self management strategies to improve quality of life and functional outcomes. ?Baseline: new program ?Target date: 12/29/2021 ?Goal status: MET ?  ?2.  Patient will report at least 50% improvement in overall symptoms and/or function to demonstrate improved functional mobility ?Baseline: 0% ?Target date: 12/29/2021 ?Goal status: MET ?  ?3.  Patient will be able to demonstrate at least 50% ROM in lumbar spine in all directions to demonstrate improved gross lumbar mobility ?Target date: 12/29/2021 ?Goal status: MET  ?  ?  ?  ?LONG TERM GOALS: ?Patient will report at least 75% improvement in overall symptoms and/or function to demonstrate improved functional mobility ?Baseline: 0% ?Target date: 01/26/2022 ?Goal status: MET ?  ?2.  Patient will be able to demonstrate squats and lifts with good form to reduce risk of re-injury ?Target date: 01/26/2022 ?Goal status: MET ?  ?3.  Patient will be able to demonstrate loaded lumbar extension and Rotation movements without pain or compensation to return to PLOF ?Target date: 01/26/2022 ?Goal: MET ?  ?  ?  ?  ?  ?PLAN: ?PT FREQUENCY: 1-2x/week for total of 12 visits over 8 week certifiation ?  ?PT DURATION: 8 weeks ?  ?PLANNED INTERVENTIONS: Therapeutic exercises, Therapeutic activity, Neuromuscular re-education, Balance training, Gait training, Patient/Family education,  Joint manipulation, Joint mobilization, DME instructions, Aquatic Therapy, Dry Needling, Electrical stimulation, Spinal manipulation, Cryotherapy, Moist heat, and Manual therapy. ?  ?PLAN FOR NEXT S

## 2022-02-02 ENCOUNTER — Encounter: Payer: BC Managed Care – PPO | Admitting: Physical Therapy

## 2022-02-03 ENCOUNTER — Encounter: Payer: Self-pay | Admitting: Family Medicine

## 2022-02-03 DIAGNOSIS — F4320 Adjustment disorder, unspecified: Secondary | ICD-10-CM | POA: Diagnosis not present

## 2022-03-03 DIAGNOSIS — F4320 Adjustment disorder, unspecified: Secondary | ICD-10-CM | POA: Diagnosis not present

## 2022-03-17 DIAGNOSIS — F4320 Adjustment disorder, unspecified: Secondary | ICD-10-CM | POA: Diagnosis not present

## 2022-03-25 ENCOUNTER — Ambulatory Visit: Payer: BC Managed Care – PPO

## 2022-03-25 ENCOUNTER — Other Ambulatory Visit: Payer: Self-pay | Admitting: Family Medicine

## 2022-03-25 ENCOUNTER — Ambulatory Visit
Admission: RE | Admit: 2022-03-25 | Discharge: 2022-03-25 | Disposition: A | Discharge status: Home / Self Care | Payer: BC Managed Care – PPO | Source: Ambulatory Visit | Attending: Family Medicine | Admitting: Family Medicine

## 2022-03-25 DIAGNOSIS — N6489 Other specified disorders of breast: Secondary | ICD-10-CM

## 2022-03-25 DIAGNOSIS — R922 Inconclusive mammogram: Secondary | ICD-10-CM | POA: Diagnosis not present

## 2022-05-12 DIAGNOSIS — F4320 Adjustment disorder, unspecified: Secondary | ICD-10-CM | POA: Diagnosis not present

## 2022-05-28 ENCOUNTER — Encounter: Payer: Self-pay | Admitting: Family Medicine

## 2022-05-28 ENCOUNTER — Telehealth: Payer: BC Managed Care – PPO | Admitting: Physician Assistant

## 2022-05-28 DIAGNOSIS — G43909 Migraine, unspecified, not intractable, without status migrainosus: Secondary | ICD-10-CM

## 2022-05-28 MED ORDER — SUMATRIPTAN SUCCINATE 25 MG PO TABS: 25.0000 mg | ORAL_TABLET | Freq: Once | ORAL | 0 refills | Status: DC

## 2022-05-28 NOTE — Patient Instructions (Signed)
Tiffany Knox, thank you for joining Piedad Climes, PA-C for today's virtual visit.  While this provider is not your primary care provider (PCP), if your PCP is located in our provider database this encounter information will be shared with them immediately following your visit.  Consent: (Patient) Tiffany Knox provided verbal consent for this virtual visit at the beginning of the encounter.  Current Medications:  Current Outpatient Medications:    escitalopram (LEXAPRO) 10 MG tablet, TAKE 1 TABLET BY MOUTH EVERY DAY, Disp: 90 tablet, Rfl: 2   Medications ordered in this encounter:  No orders of the defined types were placed in this encounter.    *If you need refills on other medications prior to your next appointment, please contact your pharmacy*  Follow-Up: Call back or seek an in-person evaluation if the symptoms worsen or if the condition fails to improve as anticipated.  Other Instructions Please keep hydrated and rest. Take the Imitrex as directed to abort headache. Make sure to keep a symptom journal as discussed and follow-up with your PCP to talk about recurring headaches. If symptoms are not resolving with what I give you, we will want you to be evaluated in person.   Migraine Headache A migraine headache is a very strong throbbing pain on one side or both sides of your head. This type of headache can also cause other symptoms. It can last from 4 hours to 3 days. Talk with your doctor about what things may bring on (trigger) this condition. What are the causes? The exact cause of this condition is not known. This condition may be triggered or caused by: Drinking alcohol. Smoking. Taking medicines, such as: Medicine used to treat chest pain (nitroglycerin). Birth control pills. Estrogen. Some blood pressure medicines. Eating or drinking certain products. Doing physical activity. Other things that may trigger a migraine headache include: Having a menstrual  period. Pregnancy. Hunger. Stress. Not getting enough sleep or getting too much sleep. Weather changes. Tiredness (fatigue). What increases the risk? Being 16-21 years old. Being female. Having a family history of migraine headaches. Being Caucasian. Having depression or anxiety. Being very overweight. What are the signs or symptoms? A throbbing pain. This pain may: Happen in any area of the head, such as on one side or both sides. Make it hard to do daily activities. Get worse with physical activity. Get worse around bright lights or loud noises. Other symptoms may include: Feeling sick to your stomach (nauseous). Vomiting. Dizziness. Being sensitive to bright lights, loud noises, or smells. Before you get a migraine headache, you may get warning signs (an aura). An aura may include: Seeing flashing lights or having blind spots. Seeing bright spots, halos, or zigzag lines. Having tunnel vision or blurred vision. Having numbness or a tingling feeling. Having trouble talking. Having weak muscles. Some people have symptoms after a migraine headache (postdromal phase), such as: Tiredness. Trouble thinking (concentrating). How is this treated? Taking medicines that: Relieve pain. Relieve the feeling of being sick to your stomach. Prevent migraine headaches. Treatment may also include: Having acupuncture. Avoiding foods that bring on migraine headaches. Learning ways to control your body functions (biofeedback). Therapy to help you know and deal with negative thoughts (cognitive behavioral therapy). Follow these instructions at home: Medicines Take over-the-counter and prescription medicines only as told by your doctor. Ask your doctor if the medicine prescribed to you: Requires you to avoid driving or using heavy machinery. Can cause trouble pooping (constipation). You may need to take  these steps to prevent or treat trouble pooping: Drink enough fluid to keep your pee  (urine) pale yellow. Take over-the-counter or prescription medicines. Eat foods that are high in fiber. These include beans, whole grains, and fresh fruits and vegetables. Limit foods that are high in fat and sugar. These include fried or sweet foods. Lifestyle Do not drink alcohol. Do not use any products that contain nicotine or tobacco, such as cigarettes, e-cigarettes, and chewing tobacco. If you need help quitting, ask your doctor. Get at least 8 hours of sleep every night. Limit and deal with stress. General instructions     Keep a journal to find out what may bring on your migraine headaches. For example, write down: What you eat and drink. How much sleep you get. Any change in what you eat or drink. Any change in your medicines. If you have a migraine headache: Avoid things that make your symptoms worse, such as bright lights. It may help to lie down in a dark, quiet room. Do not drive or use heavy machinery. Ask your doctor what activities are safe for you. Keep all follow-up visits as told by your doctor. This is important. Contact a doctor if: You get a migraine headache that is different or worse than others you have had. You have more than 15 headache days in one month. Get help right away if: Your migraine headache gets very bad. Your migraine headache lasts longer than 72 hours. You have a fever. You have a stiff neck. You have trouble seeing. Your muscles feel weak or like you cannot control them. You start to lose your balance a lot. You start to have trouble walking. You pass out (faint). You have a seizure. Summary A migraine headache is a very strong throbbing pain on one side or both sides of your head. These headaches can also cause other symptoms. This condition may be treated with medicines and changes to your lifestyle. Keep a journal to find out what may bring on your migraine headaches. Contact a doctor if you get a migraine headache that is  different or worse than others you have had. Contact your doctor if you have more than 15 headache days in a month. This information is not intended to replace advice given to you by your health care provider. Make sure you discuss any questions you have with your health care provider. Document Revised: 12/30/2018 Document Reviewed: 10/20/2018 Elsevier Patient Education  2023 Elsevier Inc.    If you have been instructed to have an in-person evaluation today at a local Urgent Care facility, please use the link below. It will take you to a list of all of our available [facility], including address, phone number and hours of operation. Please do not delay care.  [facility]  If you or a family member do not have a primary care provider, use the link below to schedule a visit and establish care. When you choose a [facility] primary care physician or advanced practice provider, you gain a long-term partner in health. Find a Primary Care Provider  Learn more about [facility]'s in-office and virtual care options: [facility] - Get Care Now

## 2022-05-28 NOTE — Progress Notes (Signed)
Virtual Visit Consent   Tiffany Knox, you are scheduled for a virtual visit with a [facility] provider today. Just as with appointments in the office, your consent must be obtained to participate. Your consent will be active for this visit and any virtual visit you may have with one of our providers in the next 365 days. If you have a MyChart account, a copy of this consent can be sent to you electronically.  As this is a virtual visit, video technology does not allow for your provider to perform a traditional examination. This may limit your provider's ability to fully assess your condition. If your provider identifies any concerns that need to be evaluated in person or the need to arrange testing (such as labs, EKG, etc.), we will make arrangements to do so. Although advances in technology are sophisticated, we cannot ensure that it will always work on either your end or our end. If the connection with a video visit is poor, the visit may have to be switched to a telephone visit. With either a video or telephone visit, we are not always able to ensure that we have a secure connection.  By engaging in this virtual visit, you consent to the provision of healthcare and authorize for your insurance to be billed (if applicable) for the services provided during this visit. Depending on your insurance coverage, you may receive a charge related to this service.  I need to obtain your verbal consent now. Are you willing to proceed with your visit today? Tiffany Knox has provided verbal consent on 05/28/2022 for a virtual visit (video or telephone). Piedad Climes, [place]  Date: 05/28/2022 10:55 AM  Virtual Visit via Video Note   I, Piedad Climes, connected with  Tiffany Knox  ([id number], 05/28/1971) on 05/28/22 at 11:15 AM EDT by a video-enabled telemedicine application and verified that I am speaking with the correct person using two identifiers.  Location: Patient: Virtual Visit Location  Patient: Home Provider: Virtual Visit Location Provider: Home Office   I discussed the limitations of evaluation and management by telemedicine and the availability of in person appointments. The patient expressed understanding and agreed to proceed.    History of Present Illness: Tiffany Knox is a 51 y.o. who identifies as a female who was assigned female at birth, and is being seen today for migraine headache. Notes persistent migraine over the past 3 days. Is r-sided and associated with photophobia and nausea. Denies vomiting. Denies overt vision changes. Has taken OTC Tylenol with only slight improvement. Notes she has been getting a migraine every 4-6 weeks the past few months. Seems to happen near her cycle.   HPI: HPI  Problems:  Patient Active Problem List   Diagnosis Date Noted  . Spondylosis of lumbar region without myelopathy or radiculopathy 06/23/2021  . History of migraine 06/15/2018  . Major depression, recurrent, chronic ([facility]) 06/15/2018    Allergies: No Known Allergies Medications:  Current Outpatient Medications:  .  SUMAtriptan (IMITREX) 25 MG tablet, Take 1 tablet (25 mg total) by mouth once for 1 dose. May repeat in 2 hours if headache persists or recurs., Disp: 10 tablet, Rfl: 0 .  escitalopram (LEXAPRO) 10 MG tablet, TAKE 1 TABLET BY MOUTH EVERY DAY, Disp: 90 tablet, Rfl: 2  Observations/Objective: Patient is well-developed, well-nourished in no acute distress.  Resting comfortably at home.  Head is normocephalic, atraumatic.  No labored breathing. Speech is clear and coherent with logical content.  Patient  is alert and oriented at baseline.   Assessment and Plan: 1. Acute migraine - SUMAtriptan (IMITREX) 25 MG tablet; Take 1 tablet (25 mg total) by mouth once for 1 dose. May repeat in 2 hours if headache persists or recurs.  Dispense: 10 tablet; Refill: 0  Without alarm signs or symptoms. Seem cyclical and related to menstrual cycles and hormonal  fluctuations. Recommend she keep a symptom journal to further assess for other patterns and potential triggers and to follow-up with PCP regarding this. Rx Imitrex for abortive therapy.   Follow Up Instructions: I discussed the assessment and treatment plan with the patient. The patient was provided an opportunity to ask questions and all were answered. The patient agreed with the plan and demonstrated an understanding of the instructions.  A copy of instructions were sent to the patient via MyChart unless otherwise noted below.   The patient was advised to call back or seek an in-person evaluation if the symptoms worsen or if the condition fails to improve as anticipated.  Time:  I spent 10 minutes with the patient via telehealth technology discussing the above problems/concerns.    Piedad Climes, PA-C

## 2022-06-15 ENCOUNTER — Encounter: Payer: Self-pay | Admitting: *Deleted

## 2022-07-07 DIAGNOSIS — F4320 Adjustment disorder, unspecified: Secondary | ICD-10-CM | POA: Diagnosis not present

## 2022-07-09 ENCOUNTER — Ambulatory Visit (INDEPENDENT_AMBULATORY_CARE_PROVIDER_SITE_OTHER): Payer: BC Managed Care – PPO | Admitting: Family Medicine

## 2022-07-09 ENCOUNTER — Encounter: Payer: Self-pay | Admitting: Family Medicine

## 2022-07-09 VITALS — BP 110/82 | HR 46 | Temp 98.0°F | Ht 62.0 in | Wt 145.6 lb

## 2022-07-09 DIAGNOSIS — G43019 Migraine without aura, intractable, without status migrainosus: Secondary | ICD-10-CM | POA: Diagnosis not present

## 2022-07-09 DIAGNOSIS — F339 Major depressive disorder, recurrent, unspecified: Secondary | ICD-10-CM

## 2022-07-09 MED ORDER — RIZATRIPTAN BENZOATE 10 MG PO TBDP: 10.0000 mg | ORAL_TABLET | ORAL | 2 refills | Status: DC | PRN

## 2022-07-09 MED ORDER — KETOROLAC TROMETHAMINE 60 MG/2ML IM SOLN
60.0000 mg | Freq: Once | INTRAMUSCULAR | Status: AC
  Administered 2022-07-09: 60 mg via INTRAMUSCULAR

## 2022-07-09 MED ORDER — KETOROLAC TROMETHAMINE 60 MG/2ML IM SOLN: 60.0000 mg | Freq: Once | INTRAMUSCULAR | Status: DC

## 2022-07-09 MED ORDER — ONDANSETRON HCL 4 MG PO TABS: 4.0000 mg | ORAL_TABLET | Freq: Three times a day (TID) | ORAL | 0 refills | Status: DC | PRN

## 2022-07-09 NOTE — Patient Instructions (Signed)
Please return for your annual complete physical; please come fasting.   Let me know if the headache does not resolve.  Try the Maxalt at the onset of the next migraine along with the nausea pill.   If you have any questions or concerns, please don't hesitate to send me a message via MyChart or call the office at [phone]. Thank you for visiting with Tiffany Knox today! It's our pleasure caring for you.   Migraine Headache A migraine headache is an intense, throbbing pain on one side or both sides of the head. Migraine headaches may also cause other symptoms, such as nausea, vomiting, and sensitivity to light and noise. A migraine headache can last from 4 hours to 3 days. Talk with your doctor about what things may bring on (trigger) your migraine headaches. What are the causes? The exact cause of this condition is not known. However, a migraine may be caused when nerves in the brain become irritated and release chemicals that cause inflammation of blood vessels. This inflammation causes pain. This condition may be triggered or caused by: Drinking alcohol. Smoking. Taking medicines, such as: Medicine used to treat chest pain (nitroglycerin). Birth control pills. Estrogen. Certain blood pressure medicines. Eating or drinking products that contain nitrates, glutamate, aspartame, or tyramine. Aged cheeses, chocolate, or caffeine may also be triggers. Doing physical activity. Other things that may trigger a migraine headache include: Menstruation. Pregnancy. Hunger. Stress. Lack of sleep or too much sleep. Weather changes. Fatigue. What increases the risk? The following factors may make you more likely to experience migraine headaches: Being a certain age. This condition is more common in people who are 58-98 years old. Being female. Having a family history of migraine headaches. Being Caucasian. Having a mental health condition, such as depression or anxiety. Being obese. What are the signs  or symptoms? The main symptom of this condition is pulsating or throbbing pain. This pain may: Happen in any area of the head, such as on one side or both sides. Interfere with daily activities. Get worse with physical activity. Get worse with exposure to bright lights or loud noises. Other symptoms may include: Nausea. Vomiting. Dizziness. General sensitivity to bright lights, loud noises, or smells. Before you get a migraine headache, you may get warning signs (an aura). An aura may include: Seeing flashing lights or having blind spots. Seeing bright spots, halos, or zigzag lines. Having tunnel vision or blurred vision. Having numbness or a tingling feeling. Having trouble talking. Having muscle weakness. Some people have symptoms after a migraine headache (postdromal phase), such as: Feeling tired. Difficulty concentrating. How is this diagnosed? A migraine headache can be diagnosed based on: Your symptoms. A physical exam. Tests, such as: CT scan or an MRI of the head. These imaging tests can help rule out other causes of headaches. Taking fluid from the spine (lumbar puncture) and analyzing it (cerebrospinal fluid analysis, or CSF analysis). How is this treated? This condition may be treated with medicines that: Relieve pain. Relieve nausea. Prevent migraine headaches. Treatment for this condition may also include: Acupuncture. Lifestyle changes like avoiding foods that trigger migraine headaches. Biofeedback. Cognitive behavioral therapy. Follow these instructions at home: Medicines Take over-the-counter and prescription medicines only as told by your health care provider. Ask your health care provider if the medicine prescribed to you: Requires you to avoid driving or using heavy machinery. Can cause constipation. You may need to take these actions to prevent or treat constipation: Drink enough fluid to keep your urine  pale yellow. Take over-the-counter or  prescription medicines. Eat foods that are high in fiber, such as beans, whole grains, and fresh fruits and vegetables. Limit foods that are high in fat and processed sugars, such as fried or sweet foods. Lifestyle Do not drink alcohol. Do not use any products that contain nicotine or tobacco, such as cigarettes, e-cigarettes, and chewing tobacco. If you need help quitting, ask your health care provider. Get at least 8 hours of sleep every night. Find ways to manage stress, such as meditation, deep breathing, or yoga. General instructions     Keep a journal to find out what may trigger your migraine headaches. For example, write down: What you eat and drink. How much sleep you get. Any change to your diet or medicines. If you have a migraine headache: Avoid things that make your symptoms worse, such as bright lights. It may help to lie down in a dark, quiet room. Do not drive or use heavy machinery. Ask your health care provider what activities are safe for you while you are experiencing symptoms. Keep all follow-up visits as told by your health care provider. This is important. Contact a health care provider if: You develop symptoms that are different or more severe than your usual migraine headache symptoms. You have more than 15 headache days in one month. Get help right away if: Your migraine headache becomes severe. Your migraine headache lasts longer than 72 hours. You have a fever. You have a stiff neck. You have vision loss. Your muscles feel weak or like you cannot control them. You start to lose your balance often. You have trouble walking. You faint. You have a seizure. Summary A migraine headache is an intense, throbbing pain on one side or both sides of the head. Migraines may also cause other symptoms, such as nausea, vomiting, and sensitivity to light and noise. This condition may be treated with medicines and lifestyle changes. You may also need to avoid certain  things that trigger a migraine headache. Keep a journal to find out what may trigger your migraine headaches. Contact your health care provider if you have more than 15 headache days in a month or you develop symptoms that are different or more severe than your usual migraine headache symptoms. This information is not intended to replace advice given to you by your health care provider. Make sure you discuss any questions you have with your health care provider. Document Revised: 12/30/2018 Document Reviewed: 10/20/2018 Elsevier Patient Education  [place].

## 2022-07-09 NOTE — Progress Notes (Signed)
Subjective  CC:  Chief Complaint  Patient presents with   Migraine    Pt stated that she feels lik the Imitrex is not working      HPI: Tiffany Knox is a 51 y.o. female who presents to the office today to address the problems listed above in the chief complaint. 51 year old female with long-term history of migraines that started as a child, here for acute headache.  She reports that over the last several years she gets monthly migraines typically that started a day before her menstrual cycle starts.  Over the last several years, the intensity of the migraine headache has worsened.  Quality remains the same, unilateral throbbing headache associated with photophobia and nausea.  Her last headache lasted several days.  I reviewed the telehealth video visit.  Imitrex was initiated.  However, Imitrex has not been helpful.  Typically her migraines are at the onset of menses, however today's migraine is midcycle.  She did recently travel by airplane.  There has been some weather changes.  No other obvious triggers identified.  As a young adult she was seen by neurology.  She did have a normal brain MRI she believes.  She was treated with amitriptyline as a preventative. Major chronic depression: We restarted Lexapro approximately 5 months ago.  She had a good response.  No longer feeling down or anxious.  Tolerating Lexapro 10 mg without adverse effects.  She feels her mood is well controlled.  Assessment  1. Migraine without aura, intractable, without status migrainosus   2. Major depression, recurrent, chronic (HCC)      Plan  Migraine intractable: Treat acutely with Toradol 60 mg IM and Zofran.  Will monitor over the next 24 hours.  Can add pain medicines if not able to break the headache cycle. Migraines, recurrent and worsening: Could be related to perimenopausal hormonal changes.  Since occurring once monthly typically predictable, will try Maxalt to treat as an abortive.  She will let me  know if it is helpful.  We can consider preventatives if unable to treat effectively.  Zofran also prescribed.  No red flag symptoms Depression: Well-controlled on Lexapro 10 mg daily.  Follow up: Return for complete physical.  Visit date not found  No orders of the defined types were placed in this encounter.  Meds ordered this encounter  Medications   rizatriptan (MAXALT-MLT) 10 MG disintegrating tablet    Sig: Take 1 tablet (10 mg total) by mouth as needed for migraine. May repeat in 2 hours if needed    Dispense:  10 tablet    Refill:  2   ondansetron (ZOFRAN) 4 MG tablet    Sig: Take 1 tablet (4 mg total) by mouth every 8 (eight) hours as needed for nausea or vomiting.    Dispense:  20 tablet    Refill:  0      I reviewed the patients updated PMH, FH, and SocHx.    Patient Active Problem List   Diagnosis Date Noted   Spondylosis of lumbar region without myelopathy or radiculopathy 06/23/2021   Migraine without aura, not intractable, without status migrainosus 06/15/2018   Major depression, recurrent, chronic (HCC) 06/15/2018   Current Meds  Medication Sig   escitalopram (LEXAPRO) 10 MG tablet TAKE 1 TABLET BY MOUTH EVERY DAY   ondansetron (ZOFRAN) 4 MG tablet Take 1 tablet (4 mg total) by mouth every 8 (eight) hours as needed for nausea or vomiting.   rizatriptan (MAXALT-MLT) 10 MG disintegrating  tablet Take 1 tablet (10 mg total) by mouth as needed for migraine. May repeat in 2 hours if needed   SUMAtriptan (IMITREX) 25 MG tablet Take 1 tablet (25 mg total) by mouth once for 1 dose. May repeat in 2 hours if headache persists or recurs.    Allergies: Patient has No Known Allergies. Family History: Patient family history includes Arthritis in her paternal grandmother; Asthma in her paternal grandmother, sister, and sister; Bladder Cancer in her mother; Breast cancer in her paternal grandmother; COPD in her father; Early death in her paternal grandfather; Healthy in her  daughter; Hearing loss in her father; Heart attack in her paternal grandfather; Hyperlipidemia in her maternal grandmother; Hypertension in her father and maternal grandmother; Kidney disease in her maternal grandfather; Lung cancer in her maternal grandfather; Miscarriages / [place] in her mother; Stroke in her mother. Social History:  Patient  reports that she quit smoking about 19 years ago. Her smoking use included cigarettes. She has a 15.00 pack-year smoking history. She has never used smokeless tobacco. She reports current alcohol use. She reports that she does not use drugs.  Review of Systems: Constitutional: Negative for fever malaise or anorexia Cardiovascular: negative for chest pain Respiratory: negative for SOB or persistent cough Gastrointestinal: negative for abdominal pain  Objective  Vitals: BP 110/82   Pulse (!) 46   Temp 98 F (36.7 C)   Ht 5\' 2"  (1.575 m)   Wt 145 lb 9.6 oz (66 kg)   SpO2 99%   BMI 26.63 kg/m  General: Appears mildly uncomfortable but nontoxic, A&Ox3 HEENT: Bilateral photophobia present, conjunctiva normal, neck is supple Cardiovascular:  RRR without murmur or gallop.  Respiratory:  Good breath sounds bilaterally, CTAB with normal respiratory effort Skin:  Warm, no rashes Neuro: Nonfocal Mood: Normal affect  Toradol 60 mg IM given in office.  Patient tolerated well.  Mild improvement in headache prior to discharge.  No adverse effects or reactions noted.  Commons side effects, risks, benefits, and alternatives for medications and treatment plan prescribed today were discussed, and the patient expressed understanding of the given instructions. Patient is instructed to call or message via MyChart if he/she has any questions or concerns regarding our treatment plan. No barriers to understanding were identified. We discussed Red Flag symptoms and signs in detail. Patient expressed understanding regarding what to do in case of urgent or emergency  type symptoms.  Medication list was reconciled, printed and provided to the patient in AVS. Patient instructions and summary information was reviewed with the patient as documented in the AVS. This note was prepared with assistance of Dragon voice recognition software. Occasional wrong-word or sound-a-like substitutions may have occurred due to the inherent limitations of voice recognition software  This visit occurred during the SARS-CoV-2 public health emergency.  Safety protocols were in place, including screening questions prior to the visit, additional usage of staff PPE, and extensive cleaning of exam room while observing appropriate contact time as indicated for disinfecting solutions.

## 2022-08-10 ENCOUNTER — Encounter: Payer: Self-pay | Admitting: Family Medicine

## 2022-08-11 ENCOUNTER — Ambulatory Visit
Admission: RE | Admit: 2022-08-11 | Discharge: 2022-08-11 | Disposition: A | Discharge status: Home / Self Care | Payer: BC Managed Care – PPO | Source: Ambulatory Visit | Attending: Family Medicine | Admitting: Family Medicine

## 2022-08-11 ENCOUNTER — Telehealth: Payer: BC Managed Care – PPO | Admitting: Family Medicine

## 2022-08-11 VITALS — BP 104/63 | HR 57 | Temp 97.6°F | Resp 18

## 2022-08-11 DIAGNOSIS — R0982 Postnasal drip: Secondary | ICD-10-CM | POA: Diagnosis not present

## 2022-08-11 DIAGNOSIS — R0602 Shortness of breath: Secondary | ICD-10-CM | POA: Diagnosis not present

## 2022-08-11 DIAGNOSIS — M549 Dorsalgia, unspecified: Secondary | ICD-10-CM | POA: Diagnosis not present

## 2022-08-11 DIAGNOSIS — R0689 Other abnormalities of breathing: Secondary | ICD-10-CM

## 2022-08-11 MED ORDER — FLUTICASONE PROPIONATE 50 MCG/ACT NA SUSP: 1.0000 | Freq: Two times a day (BID) | NASAL | 2 refills | Status: DC

## 2022-08-11 MED ORDER — ALBUTEROL SULFATE HFA 108 (90 BASE) MCG/ACT IN AERS: 2.0000 | INHALATION_SPRAY | RESPIRATORY_TRACT | 0 refills | Status: AC | PRN

## 2022-08-11 MED ORDER — CETIRIZINE HCL 10 MG PO TABS
10.0000 mg | ORAL_TABLET | Freq: Every day | ORAL | 2 refills | Status: AC
Start: 2022-08-11 — End: ?

## 2022-08-11 NOTE — ED Triage Notes (Signed)
States she is having a hard time taking a deep breath with some mid back pain since Thursday.  States she has to continually clear her throat.

## 2022-08-11 NOTE — Discharge Instructions (Signed)
I suspect her symptoms to be related to some seasonal allergy issues causing some mild reactive airway.  Will try these medications and see if that resolves her symptoms.  If not, I recommend further evaluation.

## 2022-08-11 NOTE — Progress Notes (Signed)
[place]   Inability to take in full breath without back pain, does not seem to be able to expand diaphragm to take a breath. Is an avid runner, remote history of exercised induced asthma.  Needs in person eval.

## 2022-08-11 NOTE — ED Provider Notes (Signed)
[facility]    CSN: [id number] Arrival date & time: 08/11/22  1344      History   Chief Complaint Chief Complaint  Patient presents with   Back Pain    Slight difficulty breathing - Entered by patient    HPI Tiffany Knox is a 51 y.o. female.   Patient presenting today with 5-day history of difficulty taking a deep breath, some upper back pain bilaterally, postnasal drainage causing her to constantly clear her throat, scratchy itchy throat.  Denies chest pain, wheezing, significant shortness of breath, palpitations, dizziness, diaphoresis, nausea, vomiting.  So far has not taken anything over-the-counter for symptoms.  States her symptoms are most notable when she goes outside to try to run or do other exercise.    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 No pertinent surgical history.  OB History   No obstetric history on file.      Home Medications    Prior to Admission medications   Medication Sig Start Date End Date Taking? Authorizing Provider  albuterol (VENTOLIN HFA) 108 (90 Base) MCG/ACT inhaler Inhale 2 puffs into the lungs every 4 (four) hours as needed for wheezing or shortness of breath. 08/11/22  Yes Particia Nearing, PA-C  cetirizine (ZYRTEC ALLERGY) 10 MG tablet Take 1 tablet (10 mg total) by mouth daily. 08/11/22  Yes Particia Nearing, PA-C  fluticasone [facility]) 50 MCG/ACT nasal spray Place 1 spray into both nostrils 2 (two) times daily. 08/11/22  Yes Particia Nearing, PA-C  escitalopram (LEXAPRO) 10 MG tablet TAKE 1 TABLET  BY MOUTH EVERY DAY 01/29/22   Willow Ora, MD  ondansetron (ZOFRAN) 4 MG tablet Take 1 tablet (4 mg total) by mouth every 8 (eight) hours as needed for nausea or vomiting. 07/09/22   Willow Ora, MD  rizatriptan (MAXALT-MLT) 10 MG disintegrating tablet Take 1 tablet (10 mg total) by mouth as needed for migraine. May repeat in 2 hours if needed 07/09/22   Willow Ora, MD  SUMAtriptan (IMITREX) 25 MG tablet Take 1 tablet (25 mg total) by mouth once for 1 dose. May repeat in 2 hours if headache persists or recurs. 05/28/22 07/09/22  Waldon Merl, PA-C    Family History Family History  Problem Relation Age of Onset   Bladder Cancer Mother    Stroke Mother    Miscarriages / [place] Mother    COPD Father    Hearing loss Father    Hypertension Father    Asthma Sister    Healthy Daughter    Breast cancer Paternal Grandmother    Arthritis Paternal Grandmother    Asthma Paternal Grandmother    Hyperlipidemia Maternal Grandmother    Hypertension Maternal Grandmother    Lung cancer Maternal Grandfather    Kidney disease Maternal Grandfather    Early death Paternal Grandfather    Heart attack Paternal Grandfather    Asthma Sister     Social History Social History   Tobacco Use   Smoking status: Former    Packs/day: 1.00    Years: 15.00  Total pack years: 15.00    Types: Cigarettes    Quit date: 2004    Years since quitting: 19.9   Smokeless tobacco: Never  Vaping Use   Vaping Use: Never used  Substance Use Topics   Alcohol use: Yes    Comment: Occasionally    Drug use: Never     Allergies   Patient has no known allergies.   Review of Systems Review of Systems PER HPI  Physical Exam Triage Vital Signs ED Triage Vitals  Enc Vitals Group     BP 08/11/22 1356 104/63     Pulse Rate 08/11/22 1356 (!) 57     Resp 08/11/22 1356 18     Temp 08/11/22 1356 97.6 F (36.4 C)     Temp Source 08/11/22 1356 Oral     SpO2 08/11/22 1356 99 %     Weight --       Height --      Head Circumference --      Peak Flow --      Pain Score 08/11/22 1357 2     Pain Loc --      Pain Edu? --      Excl. in GC? --    No data found.  Updated Vital Signs BP 104/63 (BP Location: Right Arm)   Pulse (!) 57   Temp 97.6 F (36.4 C) (Oral)   Resp 18   LMP 07/20/2022 (Approximate)   SpO2 99%   Visual Acuity Right Eye Distance:   Left Eye Distance:   Bilateral Distance:    Right Eye Near:   Left Eye Near:    Bilateral Near:     Physical Exam Vitals and nursing note reviewed.  Constitutional:      Appearance: Normal appearance. She is not ill-appearing.  HENT:     Head: Atraumatic.     Right Ear: Tympanic membrane normal.     Left Ear: Tympanic membrane normal.     Nose:     Comments: Nasal mucosa mildly erythematous, boggy    Mouth/Throat:     Mouth: Mucous membranes are moist.     Comments: Posterior oropharyngeal erythema Eyes:     Extraocular Movements: Extraocular movements intact.     Conjunctiva/sclera: Conjunctivae normal.  Cardiovascular:     Rate and Rhythm: Normal rate and regular rhythm.     Heart sounds: Normal heart sounds.  Pulmonary:     Effort: Pulmonary effort is normal.     Breath sounds: Normal breath sounds. No wheezing or rales.  Musculoskeletal:        General: Normal range of motion.     Cervical back: Normal range of motion and neck supple.  Skin:    General: Skin is warm and dry.  Neurological:     Mental Status: She is alert and oriented to person, place, and time.  Psychiatric:        Mood and Affect: Mood normal.        Thought Content: Thought content normal.        Judgment: Judgment normal.    UC Treatments / Results  Labs (all labs ordered are listed, but only abnormal results are displayed) Labs Reviewed - No data to display  EKG   Radiology No results found.  Procedures Procedures (including critical care time)  Medications Ordered in UC Medications - No data to display  Initial  Impression / Assessment and Plan / UC Course  I have reviewed the triage vital signs and  the nursing notes.  Pertinent labs & imaging results that were available during my care of the patient were reviewed by me and considered in my medical decision making (see chart for details).     She is slightly bradycardic today, however she states that this is her baseline as she is a runner.  Otherwise her vital signs and exam are very reassuring today with no obvious abnormalities.  Offered chest x-ray, EKG to further evaluate her symptoms but she wishes to try more conservative route first.  Do suspect her symptoms might be related to some reactive airway, seasonal allergy type issues.  She states she has had to have albuterol in the past as well.  Treat with albuterol, start allergy regimen and monitor for benefit.  Follow-up for worsening or unresolving symptoms.  Final Clinical Impressions(s) / UC Diagnoses   Final diagnoses:  SOB (shortness of breath)  Postnasal drip  Upper back pain     Discharge Instructions      I suspect her symptoms to be related to some seasonal allergy issues causing some mild reactive airway.  Will try these medications and see if that resolves her symptoms.  If not, I recommend further evaluation.    ED Prescriptions     Medication Sig Dispense Auth. Provider   albuterol (VENTOLIN HFA) 108 (90 Base) MCG/ACT inhaler Inhale 2 puffs into the lungs every 4 (four) hours as needed for wheezing or shortness of breath. 18 g Particia Nearing, [place]   cetirizine (ZYRTEC ALLERGY) 10 MG tablet Take 1 tablet (10 mg total) by mouth daily. 30 tablet Particia Nearing, PA-C   fluticasone [facility]) 50 MCG/ACT nasal spray Place 1 spray into both nostrils 2 (two) times daily. 16 g Particia Nearing, [place]      PDMP not reviewed this encounter.   Particia Nearing, [place] 08/11/22 1429

## 2022-08-25 DIAGNOSIS — F4320 Adjustment disorder, unspecified: Secondary | ICD-10-CM | POA: Diagnosis not present

## 2022-09-01 ENCOUNTER — Telehealth: Payer: Self-pay | Admitting: Family Medicine

## 2022-09-01 ENCOUNTER — Ambulatory Visit (INDEPENDENT_AMBULATORY_CARE_PROVIDER_SITE_OTHER): Payer: BC Managed Care – PPO | Admitting: Family Medicine

## 2022-09-01 ENCOUNTER — Encounter: Payer: Self-pay | Admitting: Family Medicine

## 2022-09-01 VITALS — BP 100/60 | HR 48 | Temp 97.8°F | Ht 62.0 in | Wt 145.4 lb

## 2022-09-01 DIAGNOSIS — Z1212 Encounter for screening for malignant neoplasm of rectum: Secondary | ICD-10-CM

## 2022-09-01 DIAGNOSIS — J45990 Exercise induced bronchospasm: Secondary | ICD-10-CM | POA: Diagnosis not present

## 2022-09-01 DIAGNOSIS — G43009 Migraine without aura, not intractable, without status migrainosus: Secondary | ICD-10-CM | POA: Diagnosis not present

## 2022-09-01 DIAGNOSIS — F339 Major depressive disorder, recurrent, unspecified: Secondary | ICD-10-CM

## 2022-09-01 DIAGNOSIS — M47816 Spondylosis without myelopathy or radiculopathy, lumbar region: Secondary | ICD-10-CM | POA: Diagnosis not present

## 2022-09-01 DIAGNOSIS — Z0000 Encounter for general adult medical examination without abnormal findings: Secondary | ICD-10-CM

## 2022-09-01 DIAGNOSIS — J301 Allergic rhinitis due to pollen: Secondary | ICD-10-CM

## 2022-09-01 DIAGNOSIS — Z23 Encounter for immunization: Secondary | ICD-10-CM

## 2022-09-01 DIAGNOSIS — Z1211 Encounter for screening for malignant neoplasm of colon: Secondary | ICD-10-CM

## 2022-09-01 LAB — CBC WITH DIFFERENTIAL/PLATELET
Basophils Absolute: 0.1 10*3/uL (ref 0.0–0.1)
Basophils Relative: 0.8 % (ref 0.0–3.0)
Eosinophils Absolute: 0.1 10*3/uL (ref 0.0–0.7)
Eosinophils Relative: 1.9 % (ref 0.0–5.0)
HCT: 40.4 % (ref 36.0–46.0)
Hemoglobin: 14 g/dL (ref 12.0–15.0)
Lymphocytes Relative: 30 % (ref 12.0–46.0)
Lymphs Abs: 2.2 10*3/uL (ref 0.7–4.0)
MCHC: 34.6 g/dL (ref 30.0–36.0)
MCV: 93.1 fl (ref 78.0–100.0)
Monocytes Absolute: 0.5 10*3/uL (ref 0.1–1.0)
Monocytes Relative: 6.3 % (ref 3.0–12.0)
Neutro Abs: 4.4 10*3/uL (ref 1.4–7.7)
Neutrophils Relative %: 61 % (ref 43.0–77.0)
Platelets: 254 10*3/uL (ref 150.0–400.0)
RBC: 4.34 Mil/uL (ref 3.87–5.11)
RDW: 12.9 % (ref 11.5–15.5)
WBC: 7.2 10*3/uL (ref 4.0–10.5)

## 2022-09-01 LAB — COMPREHENSIVE METABOLIC PANEL
ALT: 19 U/L (ref 0–35)
AST: 26 U/L (ref 0–37)
Albumin: 4.7 g/dL (ref 3.5–5.2)
Alkaline Phosphatase: 45 U/L (ref 39–117)
BUN: 25 mg/dL — ABNORMAL HIGH (ref 6–23)
CO2: 30 mEq/L (ref 19–32)
Calcium: 10 mg/dL (ref 8.4–10.5)
Chloride: 103 mEq/L (ref 96–112)
Creatinine, Ser: 0.92 mg/dL (ref 0.40–1.20)
GFR: 73.32 mL/min (ref >60.00)
Glucose, Bld: 100 mg/dL — ABNORMAL HIGH (ref 70–99)
Potassium: 4.5 mEq/L (ref 3.5–5.1)
Sodium: 142 mEq/L (ref 135–145)
Total Bilirubin: 0.5 mg/dL (ref 0.2–1.2)
Total Protein: 7.2 g/dL (ref 6.0–8.3)

## 2022-09-01 LAB — VITAMIN D 25 HYDROXY (VIT D DEFICIENCY, FRACTURES): VITD: 84.94 ng/mL (ref 30.00–100.00)

## 2022-09-01 LAB — TSH: TSH: 1.38 u[IU]/mL (ref 0.35–5.50)

## 2022-09-01 LAB — LIPID PANEL
Cholesterol: 174 mg/dL (ref 0–200)
HDL: 64.3 mg/dL (ref >39.00)
LDL Cholesterol: 83 mg/dL (ref 0–99)
NonHDL: 110.03
Total CHOL/HDL Ratio: 3
Triglycerides: 133 mg/dL (ref 0.0–149.0)
VLDL: 26.6 mg/dL (ref 0.0–40.0)

## 2022-09-01 NOTE — Progress Notes (Signed)
Subjective  Chief Complaint  Patient presents with   Annual Exam    Pt here for Annual exam and is not currently fasting     HPI: Tiffany Knox is a 51 y.o. female who presents to [facility] [place] today for a Female Wellness Visit. She also has the concerns and/or needs as listed above in the chief complaint. These will be addressed in addition to the Health Maintenance Visit.   Wellness Visit: annual visit with health maintenance review and exam without Pap  HM: screens: eligible for crc screen; avg risk. Mammo scheduled for jan. Pap current. Runner. Eligible for flu vaccine. Overall doing well.  Chronic disease f/u and/or acute problem visit: (deemed necessary to be done in addition to the wellness visit): Migraines: doing better. Menstrual relation and now improved with monthly abortive, maxalt. Works well. No red flag sxs.  EIA: had SOB limiting running several weeks ago. I reviewed urgent care notes. Used albuterol only once. Now resolved.  Allergies are controlled.  Low back pain: resolved with PT. Is careful not to exacerbate. Major depression: again well controlled on lexapro 10 H/o stress fracture left tibia: on high dose vit D for 2 years.   Assessment  1. Annual physical exam   2. Migraine without aura, not intractable, without status migrainosus   3. Major depression, recurrent, chronic (HCC)   4. Spondylosis of lumbar region without myelopathy or radiculopathy   5. Seasonal allergic rhinitis due to pollen   6. Exercise-induced asthma   7. Screening for colorectal cancer      Plan  Female Wellness Visit: Age appropriate Health Maintenance and Prevention measures were discussed with patient. Included topics are cancer screening recommendations, ways to keep healthy (see AVS) including dietary and exercise recommendations, regular eye and dental care, use of seat belts, and avoidance of moderate alcohol use and tobacco use. Elects cologuard BMI:  discussed patient's BMI and encouraged positive lifestyle modifications to help get to or maintain a target BMI. HM needs and immunizations were addressed and ordered. See below for orders. See HM and immunization section for updates. Flu vaccine today Routine labs and screening tests ordered including cmp, cbc and lipids where appropriate. Discussed recommendations regarding Vit D and calcium supplementation (see AVS)  Chronic disease management visit and/or acute problem visit: Migraines, menstrual: now well controlled with montlhly or every other month use of maxalt 10 odt. Will monitor. Return if worsening.  Lbp: stable Depression: well controlle.d continue lexapro 10 EIA: reports stable. Has albuterol mdi for prn use if needed.  Allergy: zyrtec and flonase Will check vit D since on 50000 units weekly x 2 years.   Follow up: Return in about 1 year (around 09/02/2023) for complete physical.  Orders Placed This Encounter  Procedures   CBC with Differential/Platelet   Comprehensive metabolic panel   Lipid panel   TSH   Cologuard   VITAMIN D 25 Hydroxy (Vit-D Deficiency, Fractures)   No orders of the defined types were placed in this encounter.     Body mass index is 26.59 kg/m. 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 Well controlled.    Health Maintenance  Topic Date Due   Fecal DNA (Cologuard)  Never done   INFLUENZA  VACCINE  04/21/2022   COVID-19 Vaccine (4 - 2023-24 season) 09/17/2022 (Originally 05/22/2022)   MAMMOGRAM  10/29/2022   PAP SMEAR-Modifier  06/23/2026   DTaP/Tdap/Td (4 - Td or Tdap) 06/24/2031   Hepatitis C Screening  Completed   HIV Screening  Completed   HPV VACCINES  Aged Out   Immunization History  Administered Date(s) Administered   Hepatitis A 12/30/2015, 07/02/2016   Influenza,inj,Quad PF,6+ Mos 06/15/2018, 06/23/2021   Moderna SARS-COV2 Booster Vaccination 09/10/2020   Moderna Sars-Covid-2 Vaccination 12/14/2019, 01/16/2020   Td 08/05/2004   Tdap 07/25/2009, 06/23/2021   We updated and reviewed the patient's past history in detail and it is documented below. Allergies: Patient has No Known Allergies. Past Medical History Patient  has a past medical history of Allergy, Depression, Heart murmur, Major depression, recurrent, chronic (HCC) (06/15/2018), and Migraines. Past Surgical History Patient  has no past surgical history on file. Family History: Patient family history includes Arthritis in her paternal grandmother; Asthma in her paternal grandmother, sister, and sister; Bladder Cancer in her mother; Breast cancer in her paternal grandmother; COPD in her father; Early death in her paternal grandfather; Healthy in her daughter; Hearing loss in her father; Heart attack in her paternal grandfather; Hyperlipidemia in her maternal grandmother; Hypertension in her father and maternal grandmother; Kidney disease in her maternal grandfather; Lung cancer in her maternal grandfather; Miscarriages / [place] in her mother; Stroke in her mother. Social History:  Patient  reports that she quit smoking about 19 years ago. Her smoking use included cigarettes. She has a 15.00 pack-year smoking history. She has never used smokeless tobacco. She reports current alcohol use. She reports that she does not use drugs.  Review of Systems: Constitutional: negative for fever or malaise Ophthalmic:  negative for photophobia, double vision or loss of vision Cardiovascular: negative for chest pain, dyspnea on exertion, or new LE swelling Respiratory: negative for SOB or persistent cough Gastrointestinal: negative for abdominal pain, change in bowel habits or melena Genitourinary: negative for dysuria or gross hematuria, no abnormal uterine bleeding or disharge Musculoskeletal: negative for new gait disturbance or muscular weakness Integumentary: negative for new or persistent rashes, no breast lumps Neurological: negative for TIA or stroke symptoms Psychiatric: negative for SI or delusions Allergic/Immunologic: negative for hives  Patient Care Team    Relationship Specialty Notifications Start End  Willow Ora, MD PCP - General Family Medicine  04/22/21     Objective  Vitals: BP 100/60   Pulse (!) 48   Temp 97.8 F (36.6 C)   Ht 5\' 2"  (1.575 m)   Wt 145 lb 6.4 oz (66 kg)   LMP 07/20/2022 (Approximate)   SpO2 98%   BMI 26.59 kg/m  General:  Well developed, well nourished, no acute distress  Psych:  Alert and orientedx3,normal mood and affect HEENT:  Normocephalic, atraumatic, non-icteric sclera,  supple neck without adenopathy, mass or thyromegaly Cardiovascular:  Normal S1, S2, RRR without gallop, rub or murmur Respiratory:  Good breath sounds bilaterally, CTAB with normal respiratory effort Gastrointestinal: normal bowel sounds, soft, non-tender, no noted masses. No HSM MSK: no deformities, contusions. Joints are without erythema or  swelling.  Skin:  Warm, no rashes or suspicious lesions noted Neurologic:    Mental status is normal. Gross motor and sensory exams are normal. Normal gait. No tremor   Commons side effects, risks, benefits, and alternatives for medications and treatment plan prescribed today were discussed, and the patient expressed understanding of the given instructions. Patient is instructed to call or message via MyChart if he/she has any questions or  concerns regarding our treatment plan. No barriers to understanding were identified. We discussed Red Flag symptoms and signs in detail. Patient expressed understanding regarding what to do in case of urgent or emergency type symptoms.  Medication list was reconciled, printed and provided to the patient in AVS. Patient instructions and summary information was reviewed with the patient as documented in the AVS. This note was prepared with assistance of Dragon voice recognition software. Occasional wrong-word or sound-a-like substitutions may have occurred due to the inherent limitations of voice recognition software

## 2022-09-01 NOTE — Patient Instructions (Addendum)
Please return in 12 months for your annual complete physical; please come fasting.  Sooner if migraines worsen.   I will release your lab results to you on your MyChart account with further instructions. You may see the results before I do, but when I review them I will send you a message with my report or have my assistant call you if things need to be discussed. Please reply to my message with any questions. Thank you!   I recommend the Cologuard test for your colon cancer screening that is due. I have ordered this test for you. [facility] will soon contact you to verify your insurance, address etc. They will then send you the kit; follow the instructions in the kit and return the kit to Cologuard. They will run the test and send the results to me. I will then give you the results. If this test is negative, we recommend repeating a colon cancer screening test in 3 years. If it is positive, I will refer you to a Gastroenterologist so you can get set up for the recommended colonoscopy.  Thank you!   If you have any questions or concerns, please don't hesitate to send me a message via MyChart or call the office at [phone]. Thank you for visiting with [place] today! It's our pleasure caring for you.   Calcium Intake Recommendations You can take Caltrate Plus twice a day or get it through your diet or other OTC supplements (Viactiv, OsCal etc)  Calcium is a mineral that affects many functions in the body, including: Blood clotting. Blood vessel function. Nerve impulse conduction. Hormone secretion. Muscle contraction. Bone and teeth functions.  Most of your body's calcium supply is stored in your bones and teeth. When your calcium stores are low, you may be at risk for low bone mass, bone loss, and bone fractures. Consuming enough calcium helps to grow healthy bones and teeth and to prevent breakdown over time. It is very important that you get enough calcium if you are: A child  undergoing rapid growth. An adolescent girl. A pre- or post-menopausal woman. A woman whose menstrual cycle has stopped due to anorexia nervosa or regular intense exercise. An individual with lactose intolerance or a milk allergy. A vegetarian.  What is my plan? Try to consume the recommended amount of calcium daily based on your age. Depending on your overall health, your health care provider may recommend increased calcium intake. General daily calcium intake recommendations by age are: Birth to 6 months: 200 mg. Infants 7 to 12 months: 260 mg. Children 1 to 3 years: 700 mg. Children 4 to 8 years: 1,000 mg. Children 9 to 13 years: 1,300 mg. Teens 14 to 18 years: 1,300 mg. Adults 19 to 50 years: 1,000 mg. Adult women 51 to 70 years: 1,200 mg. Adult men 51 to 70 years: 1,000 mg. Adults 71 years and older: 1,200 mg. Pregnant and breastfeeding teens: 1,300 mg. Pregnant and breastfeeding adults: 1,000 mg.  What do I need to know about calcium intake? In order for the body to absorb calcium, it needs vitamin D. You can get vitamin D through (we recommend getting [phone] units of Vitamin D daily) Direct exposure of the skin to sunlight. Foods, such as egg yolks, liver, saltwater fish, and fortified milk. Supplements. Consuming too much calcium may cause: Constipation. Decreased absorption of iron and zinc. Kidney stones. Calcium supplements may interact with certain medicines. Check with your health care provider before starting any calcium supplements. Try to  get most of your calcium from food. What foods can I eat? Grains  Fortified oatmeal. Fortified ready-to-eat cereals. Fortified frozen waffles. Vegetables Turnip greens. Broccoli. Fruits Fortified orange juice. Meats and Other Protein Sources Canned sardines with bones. Canned salmon with bones. Soy beans. Tofu. Baked beans. Almonds. [place] nuts. Sunflower seeds. Dairy Milk. Yogurt. Cheese. Cottage  cheese. Beverages Fortified soy milk. Fortified rice milk. Sweets/Desserts Pudding. Ice Cream. Milkshakes. Blackstrap molasses. The items listed above may not be a complete list of recommended foods or beverages. Contact your dietitian for more options. What foods can affect my calcium intake? It may be more difficult for your body to use calcium or calcium may leave your body more quickly if you consume large amounts of: Sodium. Protein. Caffeine. Alcohol.  This information is not intended to replace advice given to you by your health care provider. Make sure you discuss any questions you have with your health care provider. Document Released: 04/21/2004 Document Revised: 03/27/2016 Document Reviewed: 02/13/2014 Elsevier Interactive Patient Education  2018 [employer].

## 2022-09-01 NOTE — Telephone Encounter (Signed)
For scanned then sent via mychart per PCP team request

## 2022-10-06 DIAGNOSIS — F4320 Adjustment disorder, unspecified: Secondary | ICD-10-CM | POA: Diagnosis not present

## 2022-10-12 ENCOUNTER — Encounter: Payer: Self-pay | Admitting: Family Medicine

## 2022-10-13 ENCOUNTER — Ambulatory Visit
Admission: RE | Admit: 2022-10-13 | Discharge: 2022-10-13 | Disposition: A | Discharge status: Home / Self Care | Payer: BC Managed Care – PPO | Source: Ambulatory Visit | Attending: Family Medicine | Admitting: Family Medicine

## 2022-10-13 DIAGNOSIS — R922 Inconclusive mammogram: Secondary | ICD-10-CM | POA: Diagnosis not present

## 2022-10-13 DIAGNOSIS — N6489 Other specified disorders of breast: Secondary | ICD-10-CM

## 2022-10-14 ENCOUNTER — Encounter: Payer: BC Managed Care – PPO | Admitting: Family Medicine

## 2022-10-15 NOTE — Progress Notes (Signed)
This encounter was created in error - please disregard.

## 2022-10-22 ENCOUNTER — Encounter: Payer: Self-pay | Admitting: Family Medicine

## 2022-11-05 ENCOUNTER — Other Ambulatory Visit: Payer: Self-pay | Admitting: Family Medicine

## 2022-11-05 NOTE — Telephone Encounter (Signed)
Urgent Care Patient Requested Prescriptions  Pending Prescriptions Disp Refills   cetirizine (ZYRTEC) 10 MG tablet [Pharmacy Med Name: CETIRIZINE HCL 10 MG TABLET] 90 tablet     Sig: TAKE 1 TABLET BY MOUTH EVERY DAY     There is no refill protocol information for this order

## 2023-02-16 DIAGNOSIS — F4320 Adjustment disorder, unspecified: Secondary | ICD-10-CM | POA: Diagnosis not present

## 2023-03-04 IMAGING — US US BREAST*R* LIMITED INC AXILLA
1 series · 5 of 5 positions shown · non-contrast
Comparison: Baseline screening mammogram dated 08/20/2021.

CLINICAL DATA: Patient was recalled from baseline screening
mammogram for a possible asymmetry in the right breast.

EXAM:
DIGITAL DIAGNOSTIC UNILATERAL RIGHT MAMMOGRAM WITH TOMOSYNTHESIS AND
CAD; ULTRASOUND RIGHT BREAST LIMITED
TECHNIQUE: Right digital diagnostic mammography and breast tomosynthesis was
performed. The images were evaluated with computer-aided detection.;
Targeted ultrasound examination of the right breast was performed

[Series 1: us breast*right* limited inc axilla · 0.06mm/px · 5 of 5 slices shown]
[im 1/5]
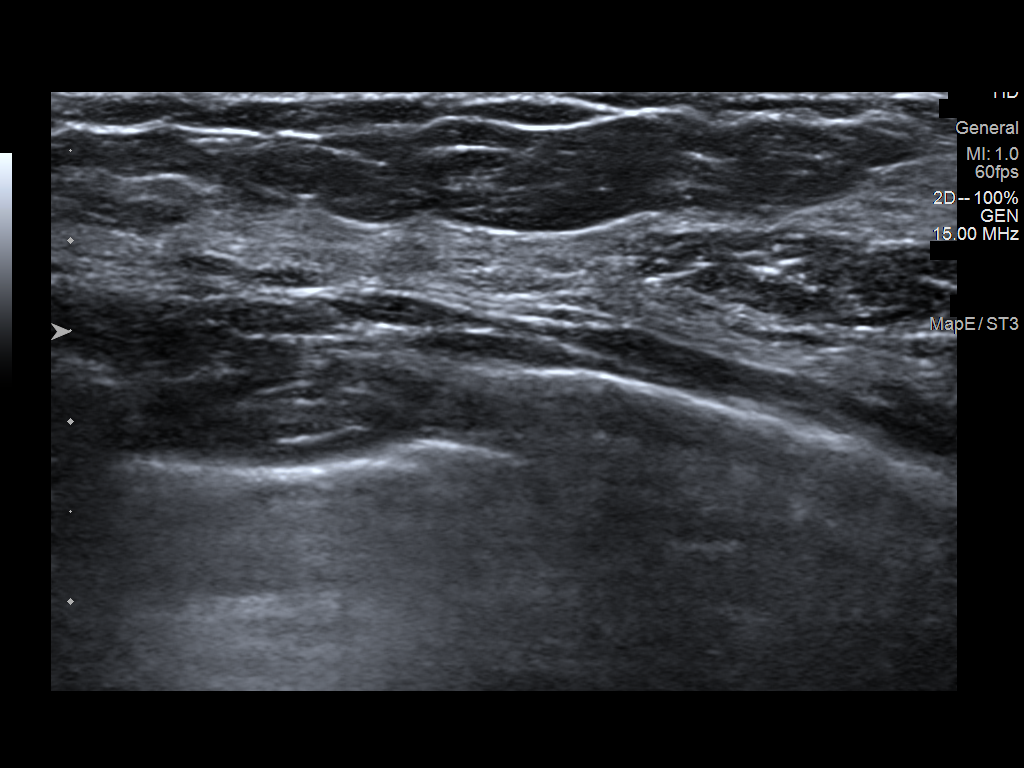
[im 2/5]
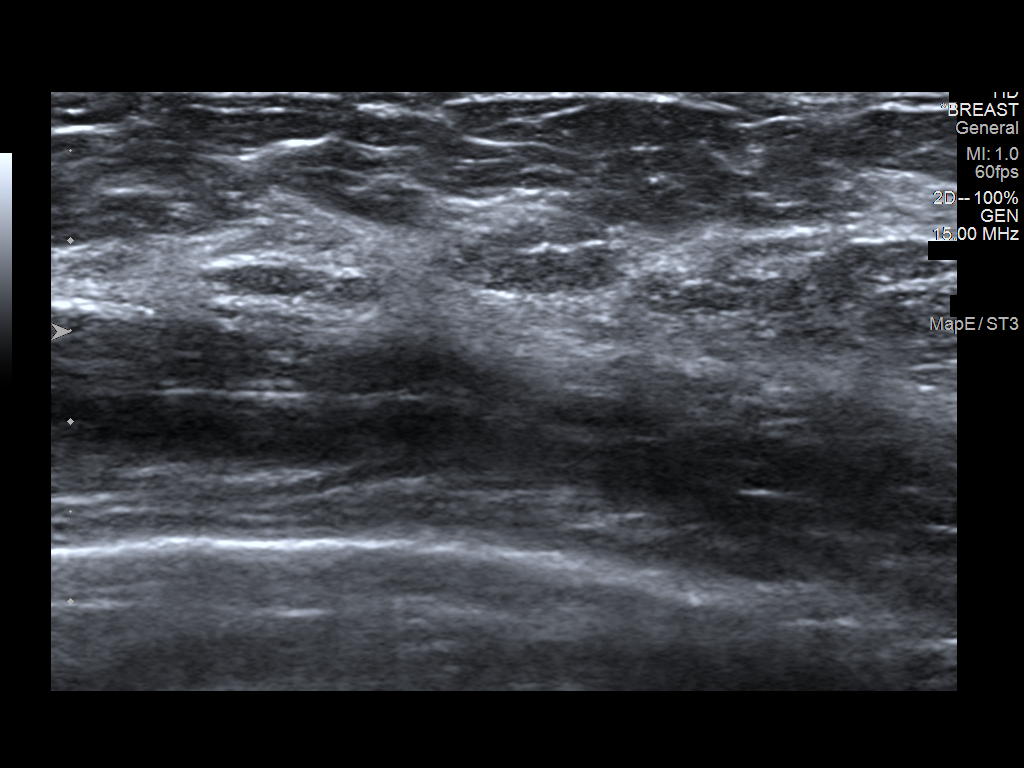
[im 3/5]
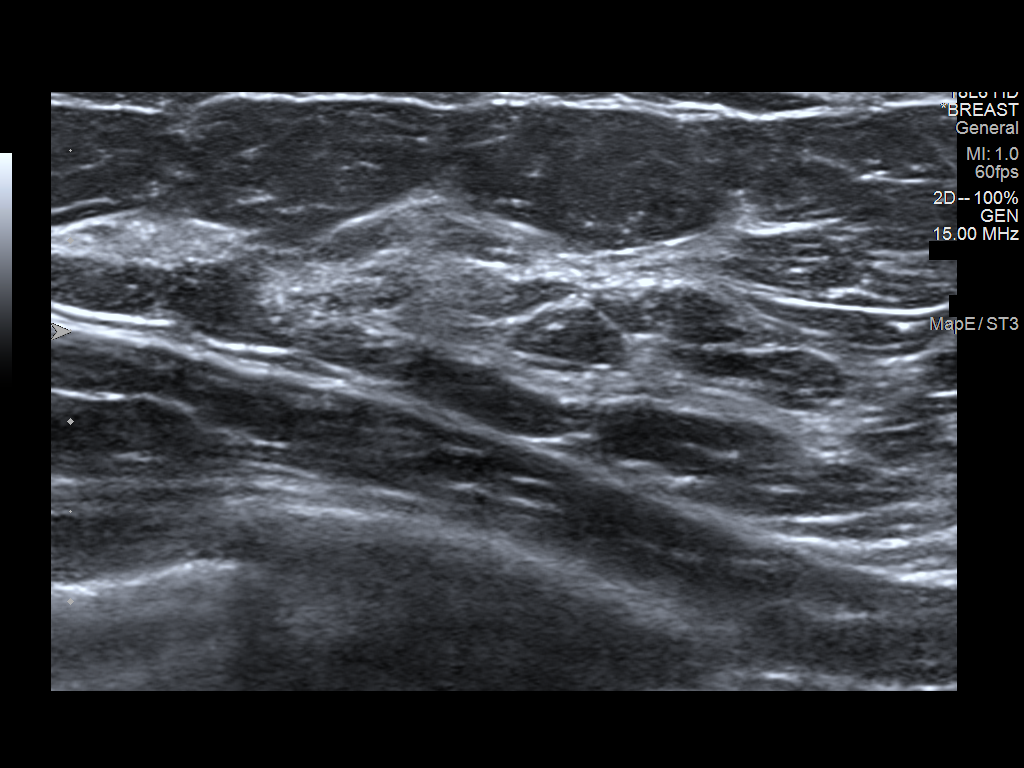
[im 4/5]
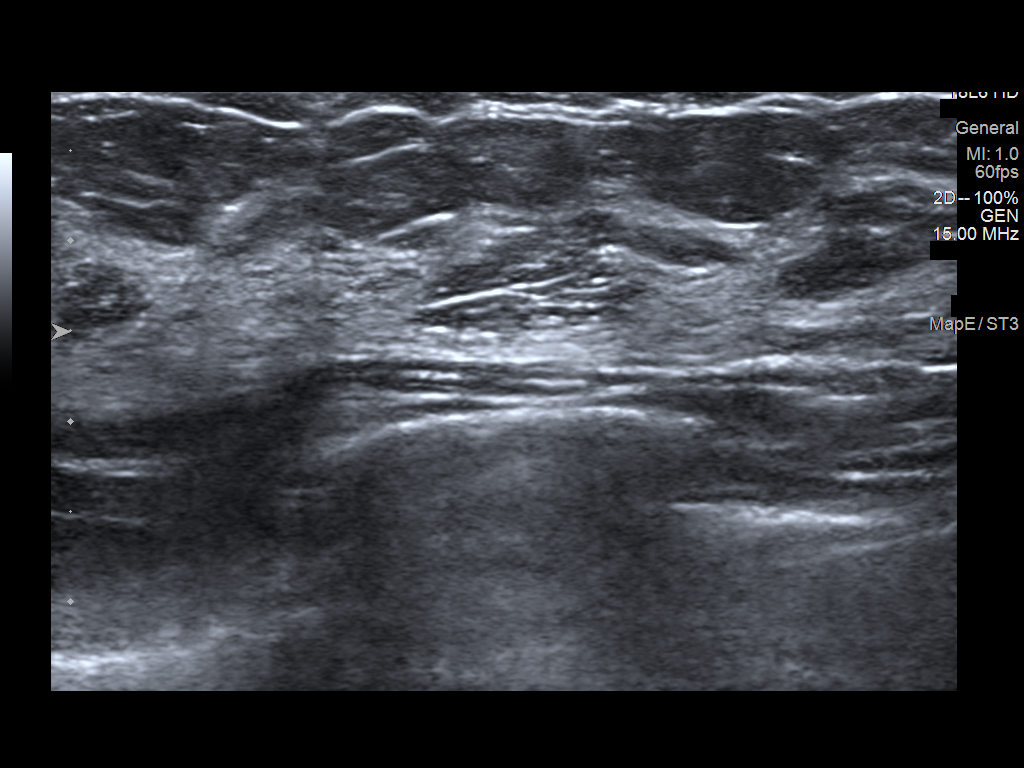
[im 5/5]
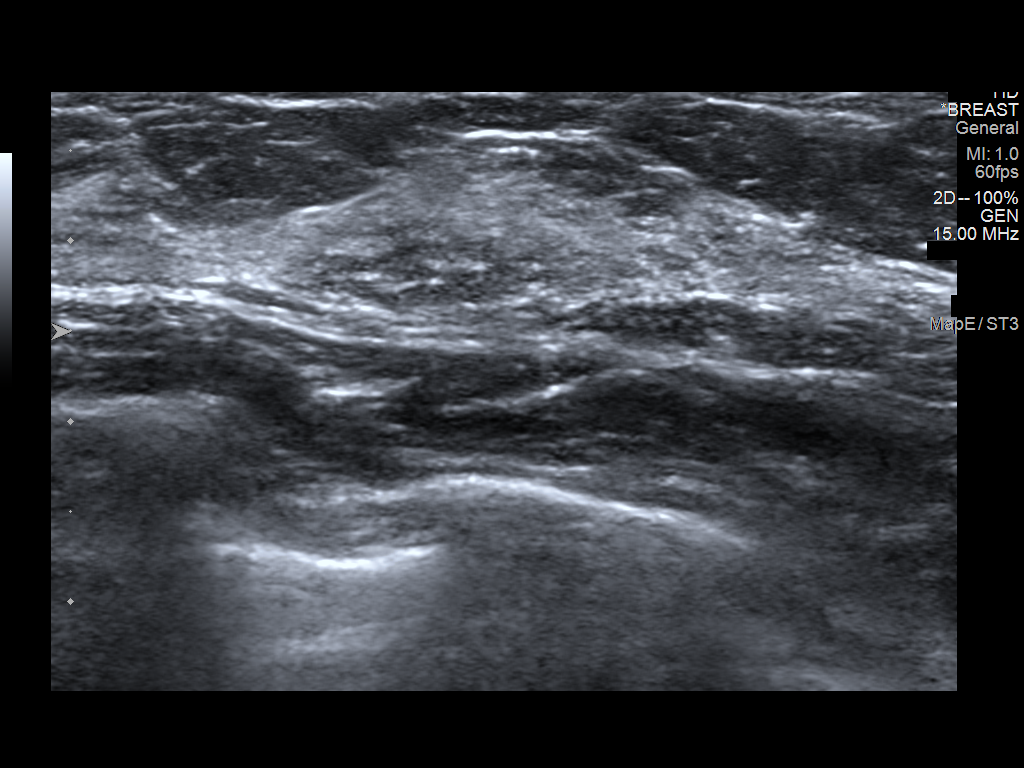

[5 of 5 positions shown; findings below may reference images not displayed]

ACR Breast Density Category c: The breast tissue is heterogeneously
dense, which may obscure small masses.
FINDINGS: Additional imaging of the right breast was performed. There is an
asymmetry in the lateral aspect of the breast without discrete mass,
malignant type microcalcifications or distortion.

Targeted ultrasound is performed, showing normal tissue throughout
the lateral aspect of the right breast. No solid or cystic mass,
distortion or abnormal shadowing detected.
IMPRESSION: Probable benign asymmetry in the right breast.

RECOMMENDATION:
Short-term interval follow-up right mammogram in 6 months is
recommended.

I have discussed the findings and recommendations with the patient.
If applicable, a reminder letter will be sent to the patient
regarding the next appointment.

BI-RADS CATEGORY  3: Probably benign.

## 2023-03-04 IMAGING — MG MM DIGITAL DIAGNOSTIC UNILAT*R* W/ TOMO W/ CAD
4 series · 4 of 12 positions shown · non-contrast
Comparison: Baseline screening mammogram dated 08/20/2021.

CLINICAL DATA: Patient was recalled from baseline screening
mammogram for a possible asymmetry in the right breast.

EXAM:
DIGITAL DIAGNOSTIC UNILATERAL RIGHT MAMMOGRAM WITH TOMOSYNTHESIS AND
CAD; ULTRASOUND RIGHT BREAST LIMITED
TECHNIQUE: Right digital diagnostic mammography and breast tomosynthesis was
performed. The images were evaluated with computer-aided detection.;
Targeted ultrasound examination of the right breast was performed

[R CC synth-2D]
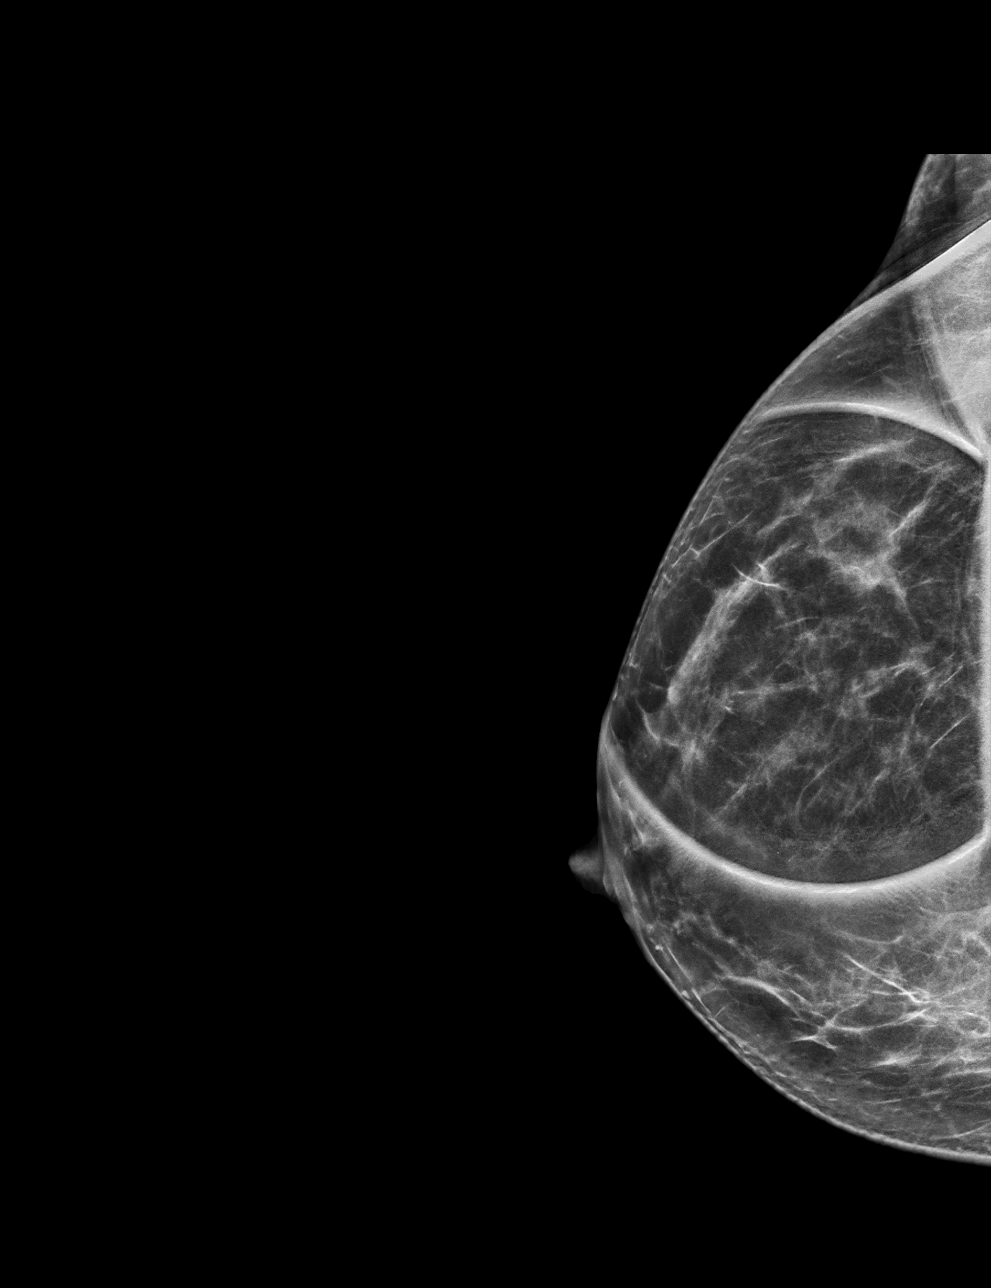

[R MLO synth-2D]
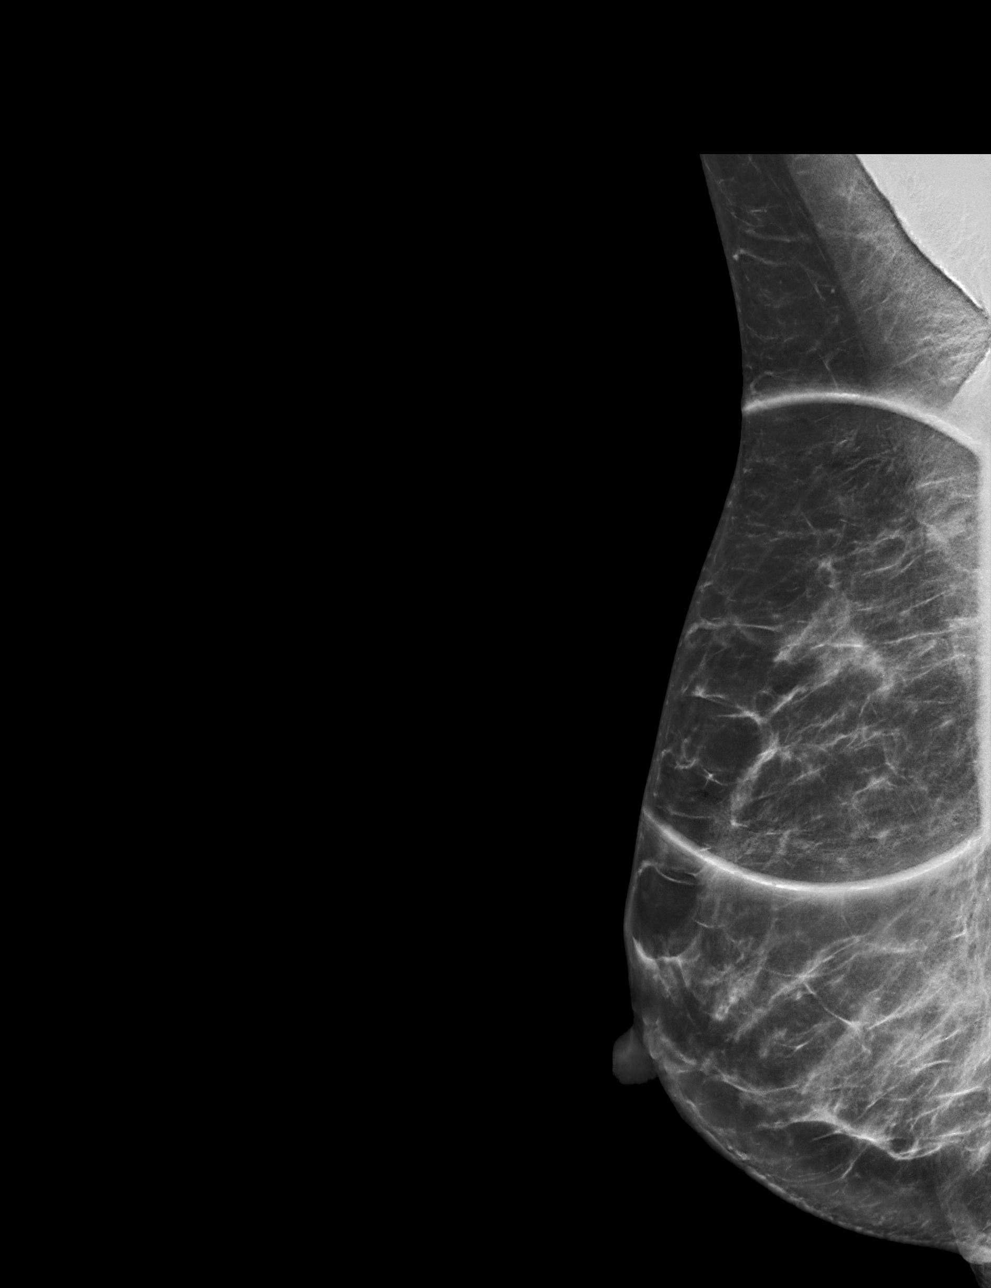

[R MLO tomo · tomo slice 35/68.0]
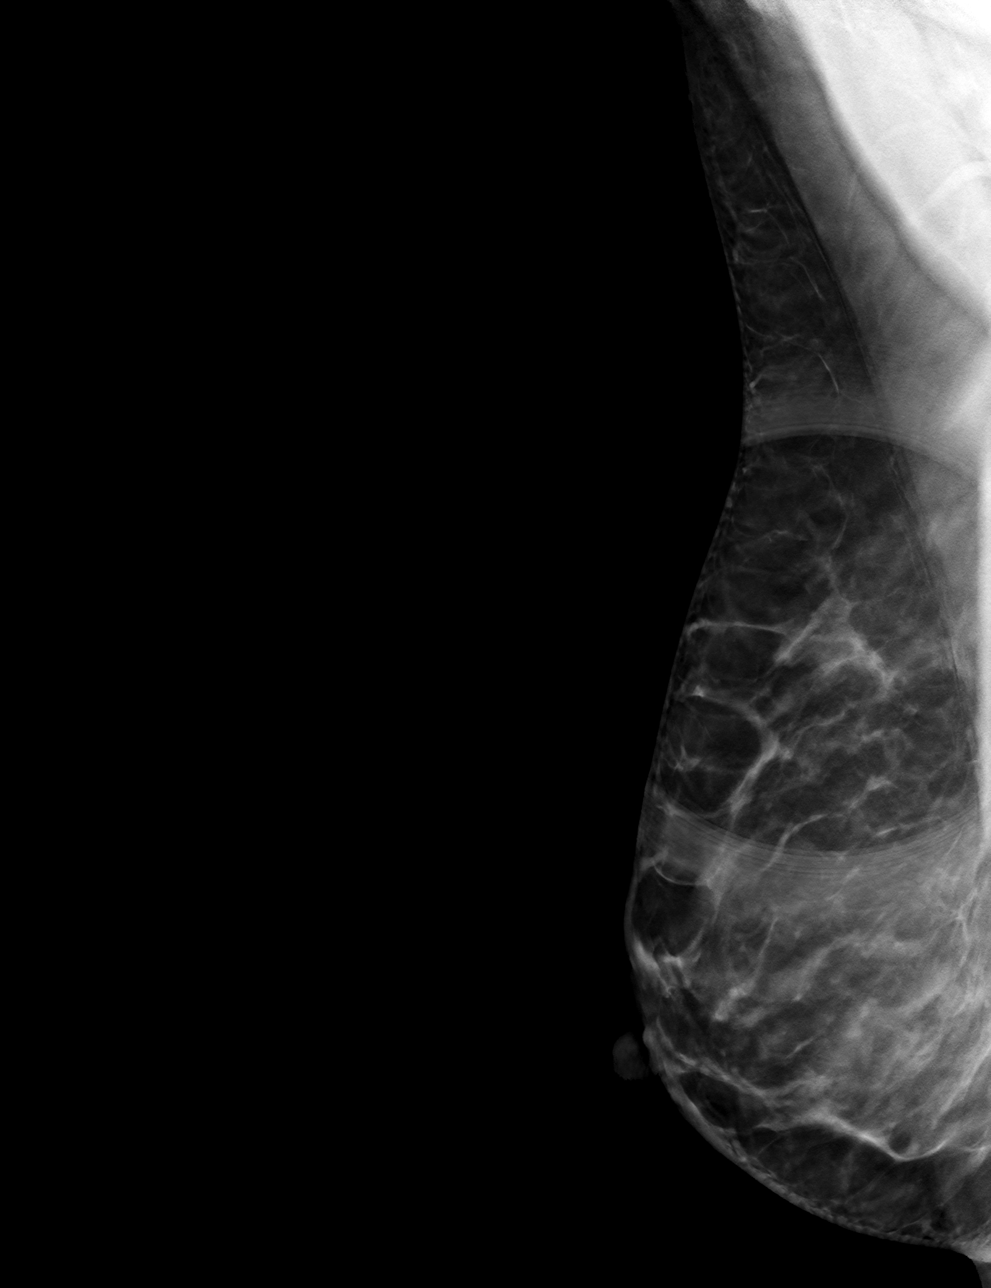

[R CC tomo · tomo slice 29/58.0]
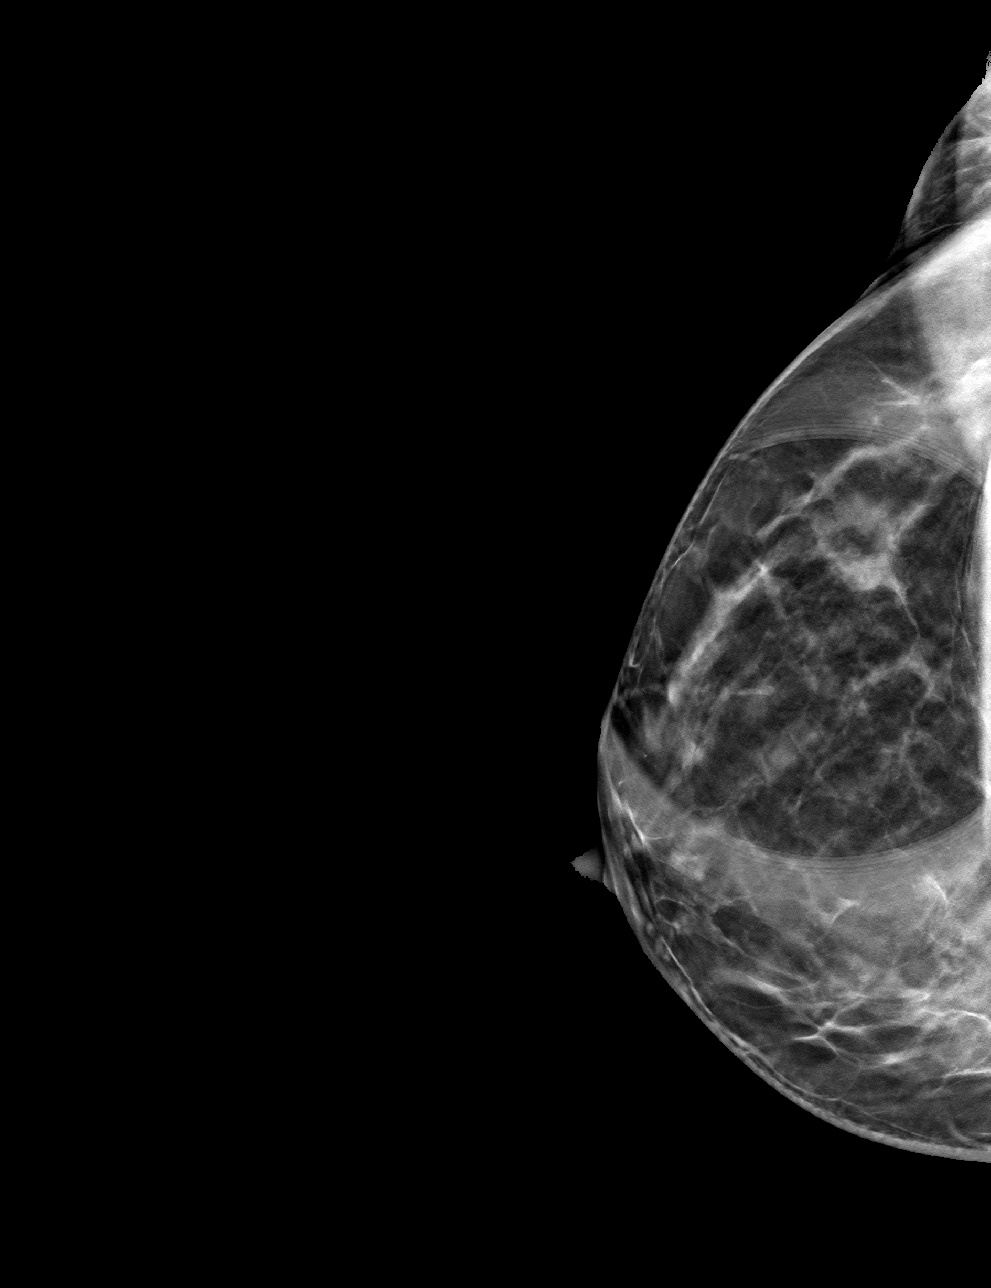

[4 of 12 positions shown; findings below may reference images not displayed]

ACR Breast Density Category c: The breast tissue is heterogeneously
dense, which may obscure small masses.
FINDINGS: Additional imaging of the right breast was performed. There is an
asymmetry in the lateral aspect of the breast without discrete mass,
malignant type microcalcifications or distortion.

Targeted ultrasound is performed, showing normal tissue throughout
the lateral aspect of the right breast. No solid or cystic mass,
distortion or abnormal shadowing detected.
IMPRESSION: Probable benign asymmetry in the right breast.

RECOMMENDATION:
Short-term interval follow-up right mammogram in 6 months is
recommended.

I have discussed the findings and recommendations with the patient.
If applicable, a reminder letter will be sent to the patient
regarding the next appointment.

BI-RADS CATEGORY  3: Probably benign.

## 2023-05-19 DIAGNOSIS — M79662 Pain in left lower leg: Secondary | ICD-10-CM | POA: Diagnosis not present

## 2023-05-25 ENCOUNTER — Other Ambulatory Visit: Payer: Self-pay | Admitting: Family Medicine

## 2023-05-27 ENCOUNTER — Other Ambulatory Visit: Payer: Self-pay | Admitting: Family Medicine

## 2023-05-27 DIAGNOSIS — M79662 Pain in left lower leg: Secondary | ICD-10-CM | POA: Diagnosis not present

## 2023-06-02 DIAGNOSIS — M79662 Pain in left lower leg: Secondary | ICD-10-CM | POA: Diagnosis not present

## 2023-06-09 DIAGNOSIS — M79662 Pain in left lower leg: Secondary | ICD-10-CM | POA: Diagnosis not present

## 2023-07-12 DIAGNOSIS — M67911 Unspecified disorder of synovium and tendon, right shoulder: Secondary | ICD-10-CM | POA: Diagnosis not present

## 2023-07-12 DIAGNOSIS — S46811A Strain of other muscles, fascia and tendons at shoulder and upper arm level, right arm, initial encounter: Secondary | ICD-10-CM | POA: Diagnosis not present

## 2023-07-16 DIAGNOSIS — M7541 Impingement syndrome of right shoulder: Secondary | ICD-10-CM | POA: Diagnosis not present

## 2023-07-16 DIAGNOSIS — M6281 Muscle weakness (generalized): Secondary | ICD-10-CM | POA: Diagnosis not present

## 2023-07-16 DIAGNOSIS — M25611 Stiffness of right shoulder, not elsewhere classified: Secondary | ICD-10-CM | POA: Diagnosis not present

## 2023-07-21 ENCOUNTER — Encounter: Payer: Self-pay | Admitting: Family Medicine

## 2023-07-21 ENCOUNTER — Ambulatory Visit: Payer: BC Managed Care – PPO | Admitting: Family Medicine

## 2023-07-21 VITALS — BP 119/61 | HR 60 | Temp 97.6°F | Ht 62.0 in | Wt 146.0 lb

## 2023-07-21 DIAGNOSIS — Z1211 Encounter for screening for malignant neoplasm of colon: Secondary | ICD-10-CM

## 2023-07-21 DIAGNOSIS — Z0001 Encounter for general adult medical examination with abnormal findings: Secondary | ICD-10-CM | POA: Diagnosis not present

## 2023-07-21 DIAGNOSIS — Z13220 Encounter for screening for lipoid disorders: Secondary | ICD-10-CM

## 2023-07-21 DIAGNOSIS — R5383 Other fatigue: Secondary | ICD-10-CM

## 2023-07-21 DIAGNOSIS — Z23 Encounter for immunization: Secondary | ICD-10-CM | POA: Diagnosis not present

## 2023-07-21 DIAGNOSIS — F339 Major depressive disorder, recurrent, unspecified: Secondary | ICD-10-CM

## 2023-07-21 DIAGNOSIS — G43009 Migraine without aura, not intractable, without status migrainosus: Secondary | ICD-10-CM | POA: Diagnosis not present

## 2023-07-21 MED ORDER — NURTEC 75 MG PO TBDP: 75.0000 mg | ORAL_TABLET | Freq: Once | ORAL | 3 refills | Status: DC | PRN

## 2023-07-21 NOTE — Progress Notes (Signed)
Subjective  Chief Complaint  Patient presents with   Annual Exam    Pt here for annual exam - c/o of being very tired for about 6 months that occurs everyday. Pt never received cologuard in mail    Depression   Migraine   Carpal Tunnel    HPI: Tiffany Knox is a 52 y.o. female who presents to [facility] today for a Female Wellness Visit. She also has the concerns and/or needs as listed above in the chief complaint. These will be addressed in addition to the Health Maintenance Visit.   Wellness Visit: annual visit with health maintenance review and exam  HM: due CRC screen. Other screens are current pt is fasting.  History of Present Illness The patient, nearing her 52th birthday, presents with a chief complaint of persistent fatigue over the past six months. The fatigue is described as constant, with the patient expressing that she could fall asleep at any given moment, despite maintaining regular physical activity. The patient has attempted to track her sleep patterns, which appeared normal, with an average of seven to seven and a half hours of sleep per night. The patient denies any mood disturbances, expressing that she is not feeling down, depressed, or unmotivated.  The patient's menstrual cycles are reported as regular, with no perimenopausal symptoms such as hot flashes. However, she experiences severe migraines on the first day of her menstrual cycle, which are partially managed with a prescription medication. The patient typically requires two to three doses of the medication to alleviate the headache, often resulting in the loss of a day due to the severity of the migraine.  The patient also reports a history of depression, for which she is currently taking Lexapro. She had previously discontinued the medication while living in [place] but restarted it after moving back to her current location. The patient denies any side effects from the medication and  does not believe it is contributing to her fatigue.  The patient's social life has been impacted by her recent move, expressing a lack of the support group she had in her previous location. She also mentions dissatisfaction in her marital relationship, describing it as more of a roommate situation. The patient has previously sought counseling for these issues.  Physically, the patient is very active, participating in a run club and training for a marathon. However, she had to stop training due to a leg injury. Despite her physical fitness, the patient reports feeling tired even during these activities. She also mentions a decrease in memory, which she attributes to having children.  The patient's diet is monitored by a nutritionist, and she reports no abnormalities in her nutritional intake. She has not had any infectious diseases, such as COVID-19, in the past six months. The patient does report snoring, but a sleep apnea test indicated that she was getting into deep sleep without waking up.  Assessment  1. Encounter for well adult exam with abnormal findings   2. Need for influenza vaccination   3. Major depression, recurrent, chronic (HCC)   4. Migraine without aura, not intractable, without status migrainosus   5. Other fatigue   6. Screening for colorectal cancer      Plan  Female Wellness Visit: Age appropriate Health Maintenance and Prevention measures were discussed with patient. Included topics are cancer screening recommendations, ways to keep healthy (see AVS) including dietary and exercise recommendations, regular eye and dental care, use of seat belts, and avoidance of  moderate alcohol use and tobacco use. Cologuard ordered BMI: discussed patient's BMI and encouraged positive lifestyle modifications to help get to or maintain a target BMI. HM needs and immunizations were addressed and ordered. See below for orders. See HM and immunization section for updates. Flu shot today Routine  labs and screening tests ordered including cmp, cbc and lipids where appropriate. Discussed recommendations regarding Vit D and calcium supplementation (see AVS)  Chronic disease management visit and/or acute problem visit: Chronic Fatigue Persistent fatigue for the past six months, not associated with mood changes or sleep disturbances. No clear etiology identified. Possible contributing factors include perimenopause and psychosocial stressors. -Order comprehensive lab work to rule out medical causes (e.g., anemia, thyroid dysfunction). -Consider counseling to address potential psychosocial stressors.  Migraines Regular migraines associated with menstrual cycle, requiring multiple doses of current medication for relief, resulting in loss of productive time. -Plan to switch to Nurtec for migraine management, pending insurance approval and availability of samples.stop maxalt  Depression: controlled. Continue lexapro 10 Follow up: 12 mo for cpe  Orders Placed This Encounter  Procedures   Flu vaccine trivalent PF, 6mos and older(Flulaval,Afluria,Fluarix,Fluzone)   VITAMIN D 25 Hydroxy (Vit-D Deficiency, Fractures)   CBC with Differential/Platelet   Comprehensive metabolic panel   Lipid panel   TSH   Vitamin B12   Sedimentation rate   Cologuard   Meds ordered this encounter  Medications   Rimegepant Sulfate (NURTEC) 75 MG TBDP    Sig: Take 1 tablet (75 mg total) by mouth once as needed. At onset of migraine    Dispense:  6 tablet    Refill:  3    Has failed imitrex and maxalt-MLT      Body mass index is 26.7 kg/m. 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 Well controlled.    Health Maintenance  Topic Date Due   Fecal DNA (Cologuard)  Never done   MAMMOGRAM  10/14/2023   Cervical Cancer Screening (HPV/Pap Cotest)  06/23/2026   DTaP/Tdap/Td (4 - Td or Tdap) 06/24/2031   INFLUENZA VACCINE  Completed   COVID-19 Vaccine  Completed   Hepatitis C Screening  Completed   HIV Screening  Completed   HPV VACCINES  Aged Out   Immunization History  Administered Date(s) Administered   Hepatitis A 12/30/2015, 07/02/2016   Influenza, Seasonal, Injecte, Preservative Fre 07/21/2023   Influenza,inj,Quad PF,6+ Mos 06/15/2018, 06/23/2021, 09/01/2022   Moderna SARS-COV2 Booster Vaccination 09/10/2020   Moderna Sars-Covid-2 Vaccination 12/14/2019, 01/16/2020   Td 08/05/2004   Tdap 07/25/2009, 06/23/2021   Unspecified SARS-COV-2 Vaccination 07/18/2023   We updated and reviewed the patient's past history in detail and it is documented below. Allergies: Patient has No Known Allergies. Past Medical History Patient  has a past medical history of Allergy, Depression, Heart murmur, Major depression, recurrent, chronic (HCC) (06/15/2018), and Migraines. Past Surgical History Patient  has no past  surgical history on file. Family History: Patient family history includes Arthritis in her paternal grandmother; Asthma in her paternal grandmother, sister, and sister; Bladder Cancer in her mother; Breast cancer in her paternal grandmother; COPD in her father; Early death in her paternal grandfather; Healthy in her daughter; Hearing loss in her father; Heart attack in her paternal grandfather; Hyperlipidemia in her maternal grandmother; Hypertension in her father and maternal grandmother; Kidney disease in her maternal  grandfather; Lung cancer in her maternal grandfather; Miscarriages / [place] in her mother; Stroke in her mother. Social History:  Patient  reports that she quit smoking about 20 years ago. Her smoking use included cigarettes. She started smoking about 35 years ago. She has a 15 pack-year smoking history. She has never used smokeless tobacco. She reports current alcohol use. She reports that she does not use drugs.  Review of Systems: Constitutional: negative for fever or malaise Ophthalmic: negative for photophobia, double vision or loss of vision Cardiovascular: negative for chest pain, dyspnea on exertion, or new LE swelling Respiratory: negative for SOB or persistent cough Gastrointestinal: negative for abdominal pain, change in bowel habits or melena Genitourinary: negative for dysuria or gross hematuria, no abnormal uterine bleeding or disharge Musculoskeletal: negative for new gait disturbance or muscular weakness Integumentary: negative for new or persistent rashes, no breast lumps Neurological: negative for TIA or stroke symptoms Psychiatric: negative for SI or delusions Allergic/Immunologic: negative for hives  Patient Care Team    Relationship Specialty Notifications Start End  Willow Ora, MD PCP - General Family Medicine  04/22/21     Objective  Vitals: BP 119/61   Pulse 60   Temp 97.6 F (36.4 C)   Ht 5\' 2"  (1.575 m)   Wt 146 lb (66.2 kg)   SpO2 97%   BMI 26.70 kg/m  General:  Well developed, well nourished, no acute distress , physically fit Psych:  Alert and orientedx3,normal mood and affect HEENT:  Normocephalic, atraumatic, non-icteric sclera,  supple neck without adenopathy, mass or thyromegaly Cardiovascular:  Normal S1, S2, RRR without gallop, rub or murmur Respiratory:  Good breath sounds bilaterally, CTAB with normal respiratory effort Gastrointestinal: normal bowel sounds, soft, non-tender, no noted masses. No HSM MSK: extremities without edema,  joints without erythema or swelling Neurologic:    Mental status is normal.  Gross motor and sensory exams are normal.  No tremor  Commons side effects, risks, benefits, and alternatives for medications and treatment plan prescribed today were discussed, and the patient expressed understanding of the given instructions. Patient is instructed to call or message via MyChart if he/she has any questions or concerns regarding our treatment plan. No barriers to understanding were identified. We discussed Red Flag symptoms and signs in detail. Patient expressed understanding regarding what to do in case of urgent or emergency type symptoms.  Medication list was reconciled, printed and provided to the patient in AVS. Patient instructions and summary information was reviewed with the patient as documented in the AVS. This note was prepared with assistance of Dragon voice recognition software. Occasional wrong-word or sound-a-like substitutions may have occurred due to the inherent limitations of voice recognition software

## 2023-07-21 NOTE — Patient Instructions (Signed)
Please return in 12 months for your annual complete physical; please come fasting.   If you have any questions or concerns, please don't hesitate to send me a message via MyChart or call the office at [phone]. Thank you for visiting with [place] today! It's our pleasure caring for you.   VISIT SUMMARY:  During your visit, we discussed your persistent fatigue, migraines, and the need for a colon cancer screening. We reviewed your symptoms and medical history in detail to determine the best course of action.  YOUR PLAN:  -CHRONIC FATIGUE: Chronic fatigue means feeling extremely tired for a long period without a clear reason. We will conduct comprehensive lab work to rule out any medical causes such as anemia or thyroid issues. Additionally, counseling may help address any potential stressors that could be contributing to your fatigue.  -MIGRAINES: Migraines are severe headaches that can cause significant discomfort and disrupt daily activities. Since your current medication requires multiple doses and results in lost productive time, we plan to switch you to Nurtec for better management, pending insurance approval and availability of samples.  -COLON CANCER SCREENING: Colon cancer screening is a routine test to check for signs of colon cancer, especially important as you approach 50. We will order a Cologuard test for you, which is a non-invasive way to screen for colon cancer.  INSTRUCTIONS:  Please complete the lab work as soon as possible. We will notify you once the Nurtec samples are available and approved by your insurance. Additionally, make sure to complete the Cologuard test for colon cancer screening.

## 2023-07-22 LAB — CBC WITH DIFFERENTIAL/PLATELET
Basophils Absolute: 0 10*3/uL (ref 0.0–0.1)
Basophils Relative: 0.6 % (ref 0.0–3.0)
Eosinophils Absolute: 0.2 10*3/uL (ref 0.0–0.7)
Eosinophils Relative: 2.2 % (ref 0.0–5.0)
HCT: 42 % (ref 36.0–46.0)
Hemoglobin: 13.9 g/dL (ref 12.0–15.0)
Lymphocytes Relative: 29.4 % (ref 12.0–46.0)
Lymphs Abs: 2 10*3/uL (ref 0.7–4.0)
MCHC: 33.2 g/dL (ref 30.0–36.0)
MCV: 94.6 fL (ref 78.0–100.0)
Monocytes Absolute: 0.5 10*3/uL (ref 0.1–1.0)
Monocytes Relative: 7.5 % (ref 3.0–12.0)
Neutro Abs: 4.2 10*3/uL (ref 1.4–7.7)
Neutrophils Relative %: 60.3 % (ref 43.0–77.0)
Platelets: 227 10*3/uL (ref 150.0–400.0)
RBC: 4.44 Mil/uL (ref 3.87–5.11)
RDW: 13 % (ref 11.5–15.5)
WBC: 6.9 10*3/uL (ref 4.0–10.5)

## 2023-07-22 LAB — COMPREHENSIVE METABOLIC PANEL
ALT: 27 U/L (ref 0–35)
AST: 34 U/L (ref 0–37)
Albumin: 4.6 g/dL (ref 3.5–5.2)
Alkaline Phosphatase: 41 U/L (ref 39–117)
BUN: 13 mg/dL (ref 6–23)
CO2: 29 meq/L (ref 19–32)
Calcium: 9.5 mg/dL (ref 8.4–10.5)
Chloride: 99 meq/L (ref 96–112)
Creatinine, Ser: 0.82 mg/dL (ref 0.40–1.20)
GFR: 83.65 mL/min (ref >60.00)
Glucose, Bld: 76 mg/dL (ref 70–99)
Potassium: 3.9 meq/L (ref 3.5–5.1)
Sodium: 137 meq/L (ref 135–145)
Total Bilirubin: 0.5 mg/dL (ref 0.2–1.2)
Total Protein: 7.5 g/dL (ref 6.0–8.3)

## 2023-07-22 LAB — LIPID PANEL
Cholesterol: 168 mg/dL (ref 0–200)
HDL: 67.4 mg/dL (ref >39.00)
LDL Cholesterol: 89 mg/dL (ref 0–99)
NonHDL: 100.77
Total CHOL/HDL Ratio: 2
Triglycerides: 58 mg/dL (ref 0.0–149.0)
VLDL: 11.6 mg/dL (ref 0.0–40.0)

## 2023-07-22 LAB — TSH: TSH: 2.08 u[IU]/mL (ref 0.35–5.50)

## 2023-07-22 LAB — SEDIMENTATION RATE: Sed Rate: 3 mm/h (ref 0–20)

## 2023-07-22 LAB — VITAMIN D 25 HYDROXY (VIT D DEFICIENCY, FRACTURES): VITD: 62.68 ng/mL (ref 30.00–100.00)

## 2023-07-22 LAB — VITAMIN B12: Vitamin B-12: 487 pg/mL (ref 211–911)

## 2023-07-22 NOTE — Progress Notes (Signed)
See mychart note Dear Tiffany Knox, As expected, your lab results are all perfect. I ordered some extra tests including vitamin levels, inflammatory marker and thyroid which are also all normal. I am happy there are no markers of serious disease; your fatigue is likely related to other things. I hope you are able to get to a better place.   Let me know if you need anything. Sincerely, Dr. Mardelle Matte

## 2023-08-16 DIAGNOSIS — Z1211 Encounter for screening for malignant neoplasm of colon: Secondary | ICD-10-CM | POA: Diagnosis not present

## 2023-08-16 DIAGNOSIS — Z1212 Encounter for screening for malignant neoplasm of rectum: Secondary | ICD-10-CM | POA: Diagnosis not present

## 2023-08-20 ENCOUNTER — Other Ambulatory Visit (HOSPITAL_COMMUNITY): Payer: Self-pay

## 2023-08-24 LAB — COLOGUARD: COLOGUARD: NEGATIVE

## 2023-08-24 NOTE — Progress Notes (Signed)
Negative cologuard. Mc note sent

## 2023-08-27 ENCOUNTER — Encounter: Payer: Self-pay | Admitting: Family Medicine

## 2023-08-31 NOTE — Telephone Encounter (Signed)
I do not see any documentation regarding the nurtec RX or PA. This was from October! Can you please look into this and see what is needed.  As well, can we offer her samples?

## 2023-09-03 ENCOUNTER — Encounter: Payer: BC Managed Care – PPO | Admitting: Family Medicine

## 2023-09-08 ENCOUNTER — Encounter: Payer: BC Managed Care – PPO | Admitting: Family Medicine

## 2023-09-23 ENCOUNTER — Telehealth: Payer: Self-pay | Admitting: Pharmacy Technician

## 2023-09-23 ENCOUNTER — Other Ambulatory Visit (HOSPITAL_COMMUNITY): Payer: Self-pay

## 2023-09-23 NOTE — Telephone Encounter (Signed)
 Pharmacy Patient Advocate Encounter  Received notification from [facility] [place]   that Prior Authorization for Nurtec ODT 75mg  has been APPROVED from 09/23/2023 to 09/22/2024. Ran test claim, Copay is $19.97. This test claim was processed through [facility]- copay amounts may vary at other pharmacies due to pharmacy/plan contracts, or as the patient moves through the different stages of their insurance plan.   PA #/Case ID/Reference #: PA Case ID #: [id number]

## 2023-09-23 NOTE — Telephone Encounter (Signed)
 Pharmacy Patient Advocate Encounter   Received notification from CoverMyMeds that prior authorization for Nurtec 75MG  dispersible tablets is required/requested.   Insurance verification completed.   The patient is insured through [employer] [facility] [place]   .   Per test claim: PA required; PA submitted to above mentioned insurance via CoverMyMeds Key/confirmation #/EOC [id number] Status is pending

## 2024-03-14 ENCOUNTER — Encounter: Payer: Self-pay | Admitting: Family Medicine

## 2024-06-08 DIAGNOSIS — L814 Other melanin hyperpigmentation: Secondary | ICD-10-CM | POA: Diagnosis not present

## 2024-06-08 DIAGNOSIS — L821 Other seborrheic keratosis: Secondary | ICD-10-CM | POA: Diagnosis not present

## 2024-06-08 DIAGNOSIS — D2372 Other benign neoplasm of skin of left lower limb, including hip: Secondary | ICD-10-CM | POA: Diagnosis not present

## 2024-06-08 DIAGNOSIS — L578 Other skin changes due to chronic exposure to nonionizing radiation: Secondary | ICD-10-CM | POA: Diagnosis not present

## 2024-06-18 ENCOUNTER — Other Ambulatory Visit: Payer: Self-pay | Admitting: Family Medicine

## 2024-06-18 DIAGNOSIS — Z23 Encounter for immunization: Secondary | ICD-10-CM | POA: Diagnosis not present

## 2024-07-24 ENCOUNTER — Encounter: Payer: BC Managed Care – PPO | Admitting: Family Medicine

## 2024-08-03 ENCOUNTER — Encounter: Payer: Self-pay | Admitting: Family Medicine

## 2024-08-03 ENCOUNTER — Ambulatory Visit (INDEPENDENT_AMBULATORY_CARE_PROVIDER_SITE_OTHER): Admitting: Family Medicine

## 2024-08-03 VITALS — BP 104/62 | HR 49 | Temp 98.1°F | Ht 62.0 in | Wt 140.6 lb

## 2024-08-03 DIAGNOSIS — Z1231 Encounter for screening mammogram for malignant neoplasm of breast: Secondary | ICD-10-CM

## 2024-08-03 DIAGNOSIS — F339 Major depressive disorder, recurrent, unspecified: Secondary | ICD-10-CM

## 2024-08-03 DIAGNOSIS — G43009 Migraine without aura, not intractable, without status migrainosus: Secondary | ICD-10-CM

## 2024-08-03 DIAGNOSIS — J301 Allergic rhinitis due to pollen: Secondary | ICD-10-CM | POA: Diagnosis not present

## 2024-08-03 DIAGNOSIS — Z23 Encounter for immunization: Secondary | ICD-10-CM | POA: Diagnosis not present

## 2024-08-03 DIAGNOSIS — Z0000 Encounter for general adult medical examination without abnormal findings: Secondary | ICD-10-CM

## 2024-08-03 LAB — COMPREHENSIVE METABOLIC PANEL WITH GFR
ALT: 16 U/L (ref 0–35)
AST: 21 U/L (ref 0–37)
Albumin: 4.4 g/dL (ref 3.5–5.2)
Alkaline Phosphatase: 44 U/L (ref 39–117)
BUN: 14 mg/dL (ref 6–23)
CO2: 30 meq/L (ref 19–32)
Calcium: 9 mg/dL (ref 8.4–10.5)
Chloride: 101 meq/L (ref 96–112)
Creatinine, Ser: 0.83 mg/dL (ref 0.40–1.20)
GFR: 81.85 mL/min (ref >60.00)
Glucose, Bld: 75 mg/dL (ref 70–99)
Potassium: 4 meq/L (ref 3.5–5.1)
Sodium: 138 meq/L (ref 135–145)
Total Bilirubin: 0.3 mg/dL (ref 0.2–1.2)
Total Protein: 6.8 g/dL (ref 6.0–8.3)

## 2024-08-03 LAB — CBC WITH DIFFERENTIAL/PLATELET
Basophils Absolute: 0 K/uL (ref 0.0–0.1)
Basophils Relative: 0.7 % (ref 0.0–3.0)
Eosinophils Absolute: 0.1 K/uL (ref 0.0–0.7)
Eosinophils Relative: 1.8 % (ref 0.0–5.0)
HCT: 40 % (ref 36.0–46.0)
Hemoglobin: 13.9 g/dL (ref 12.0–15.0)
Lymphocytes Relative: 31.5 % (ref 12.0–46.0)
Lymphs Abs: 1.9 K/uL (ref 0.7–4.0)
MCHC: 34.6 g/dL (ref 30.0–36.0)
MCV: 92.1 fl (ref 78.0–100.0)
Monocytes Absolute: 0.4 K/uL (ref 0.1–1.0)
Monocytes Relative: 5.9 % (ref 3.0–12.0)
Neutro Abs: 3.7 K/uL (ref 1.4–7.7)
Neutrophils Relative %: 60.1 % (ref 43.0–77.0)
Platelets: 225 K/uL (ref 150.0–400.0)
RBC: 4.35 Mil/uL (ref 3.87–5.11)
RDW: 13.2 % (ref 11.5–15.5)
WBC: 6.1 K/uL (ref 4.0–10.5)

## 2024-08-03 LAB — LIPID PANEL
Cholesterol: 159 mg/dL (ref 0–200)
HDL: 59.7 mg/dL (ref >39.00)
LDL Cholesterol: 86 mg/dL (ref 0–99)
NonHDL: 99.52
Total CHOL/HDL Ratio: 3
Triglycerides: 68 mg/dL (ref 0.0–149.0)
VLDL: 13.6 mg/dL (ref 0.0–40.0)

## 2024-08-03 NOTE — Progress Notes (Signed)
 Subjective  Chief Complaint  Patient presents with   Annual Exam    HPI: Tiffany Knox is a 53 y.o. female who presents to [facility] today for a Female Wellness Visit. She also has the concerns and/or needs as listed above in the chief complaint. These will be addressed in addition to the Health Maintenance Visit.   Wellness Visit: annual visit with health maintenance review and exam  HM: screens due: mammogram. Pap and CRC are up to date.  Patient is thriving.  Avid runner.  Children are 14 and 17 and doing well.  Healthy lifestyle.  She is eligible for Shingrix and Prevnar 20  Chronic disease f/u and/or acute problem visit: (deemed necessary to be done in addition to the wellness visit): Discussed the use of AI scribe software for clinical note transcription with the patient, who gave verbal consent to proceed.  History of Present Illness Tiffany Knox is a 53 year old female who presents for a routine follow-up and vaccination update.  Migraine headaches - Migraines are currently controllable and significantly improved with Nurtec. - Typically requires two doses of Nurtec for effective relief. - Migraines remain associated with her regular menstrual cycle. - No menopausal symptoms or changes in menstrual cycle.  Major chronic depression is well-controlled again on Lexapro  10.  No adverse effects.  Preventive health maintenance - Mammogram is overdue by nearly two years.  Immunization status - Received influenza and COVID vaccinations.    Assessment  1. Annual physical exam   2. Migraine without aura, not intractable, without status migrainosus   3. Major depression, recurrent, chronic   4. Seasonal allergic rhinitis due to pollen   5. Screening mammogram for breast cancer   6. Need for shingles vaccine      Plan  Female Wellness Visit: Age appropriate Health Maintenance and Prevention measures were discussed with patient. Included  topics are cancer screening recommendations, ways to keep healthy (see AVS) including dietary and exercise recommendations, regular eye and dental care, use of seat belts, and avoidance of moderate alcohol use and tobacco use.  BMI: discussed patient's BMI and encouraged positive lifestyle modifications to help get to or maintain a target BMI. HM needs and immunizations were addressed and ordered. See below for orders. See HM and immunization section for updates.  Shingrix No. 1 given today, she will return in 6 months for second.  Defer Prevnar till next year. Routine labs and screening tests ordered including cmp, cbc and lipids where appropriate. Discussed recommendations regarding Vit D and calcium supplementation (see AVS)  Chronic disease management visit and/or acute problem visit: Assessment and Plan Assessment & Plan Adult Wellness Visit Routine adult wellness visit with no new concerns. Regular exercise and good nutrition reported. Bone health likely maintained due to exercise. Vitamin D  supplementation discussed. - Ordered basic lab work including glucose - Recommended vitamin D  supplementation - Encouraged good nutrition  Migraine Migraines are well-controlled with Nurtec, taken as needed, typically two tablets. Migraines are menstrual-related with no changes in menstrual cycle. - Continue Nurtec as needed for migraine management  Depression Well-managed with Lexapro . - Continue Lexapro  10  Allergic rhinitis due to pollen No new symptoms or concerns reported. - Continue current management  Screening mammogram for malignant neoplasm of breast Mammogram is overdue, last performed nearly two years ago. Discussed importance of mammogram for breast cancer screening and benefits of having it done at a breast center for continuity of care. - Schedule mammogram  at a breast center    Follow up: 1 year for complete physical Orders Placed This Encounter  Procedures   MM DIGITAL  SCREENING BILATERAL   Zoster Recombinant (Shingrix )   Lipid panel   Comprehensive metabolic panel with GFR   CBC with Differential/Platelet   No orders of the defined types were placed in this encounter.     Body mass index is 25.72 kg/m. 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 Well controlled.    Health Maintenance  Topic Date Due   Hepatitis B Vaccines 19-59 Average Risk (1 of 3 - 19+ 3-dose series) Never done   Mammogram  10/14/2023   Zoster Vaccines- Shingrix (2 of 2) 11/03/2024 (Originally 09/28/2024)   Pneumococcal Vaccine: 50+ Years (1 of 2 - PCV) 08/03/2025 (Originally 07/22/1992)   Cervical Cancer Screening (HPV/Pap Cotest)  06/23/2026   Fecal DNA (Cologuard)  08/15/2026   DTaP/Tdap/Td (4 - Td or Tdap) 06/24/2031   Influenza Vaccine  Completed   COVID-19 Vaccine  Completed   Hepatitis C Screening  Completed   HIV Screening  Completed   HPV VACCINES  Aged Out   Meningococcal B Vaccine  Aged Out   Immunization History  Administered Date(s) Administered   Hepatitis A 12/30/2015, 07/02/2016   Influenza, Seasonal, Injecte, Preservative Fre 07/21/2023, 06/18/2024   Influenza,inj,Quad PF,6+ Mos 06/15/2018, 06/23/2021, 09/01/2022   Moderna SARS-COV2 Booster Vaccination 09/10/2020   Moderna Sars-Covid-2 Vaccination 12/14/2019, 01/16/2020   Pfizer(Comirnaty)Fall Seasonal Vaccine 12 years and older 06/18/2024   Td 08/05/2004   Tdap  07/25/2009, 06/23/2021   Unspecified SARS-COV-2 Vaccination 07/18/2023   Zoster Recombinant(Shingrix) 08/03/2024   We updated and reviewed the patient's past history in detail and it is documented below. Allergies: Patient has no known allergies. Past Medical History Patient  has a past medical history of Allergy, Depression, Heart murmur, Major depression, recurrent, chronic (06/15/2018), and Migraines. Past Surgical History Patient  has no past surgical history on file. Family History: Patient family history includes Arthritis in her paternal grandmother; Asthma in her paternal grandmother, sister, and sister; Bladder Cancer in her mother; Breast cancer in her paternal grandmother; COPD in her father; Early death in her paternal grandfather; Healthy in her daughter; Hearing loss in her father; Heart attack in her paternal grandfather; Hyperlipidemia in her maternal grandmother; Hypertension in her father and maternal grandmother; Kidney disease in her maternal grandfather; Lung cancer in her maternal grandfather; Miscarriages / Stillbirths in her mother; Stroke in her mother. Social History:  Patient  reports that she quit smoking about 21 years ago. Her smoking use included cigarettes. She started smoking about 36 years ago. She has a 15 pack-year smoking history. She has never used smokeless tobacco. She reports current alcohol use. She reports that she does not use drugs.  Review of Systems: Constitutional: negative for fever or malaise Ophthalmic: negative for photophobia, double vision or loss of vision Cardiovascular: negative for chest pain, dyspnea on  exertion, or new LE swelling Respiratory: negative for SOB or persistent cough Gastrointestinal: negative for abdominal pain, change in bowel habits or melena Genitourinary: negative for dysuria or gross hematuria, no abnormal uterine bleeding or disharge Musculoskeletal: negative for new gait disturbance or muscular  weakness Integumentary: negative for new or persistent rashes, no breast lumps Neurological: negative for TIA or stroke symptoms Psychiatric: negative for SI or delusions Allergic/Immunologic: negative for hives  Patient Care Team    Relationship Specialty Notifications Start End  Jodie Lavern CROME, MD PCP - General Family Medicine  04/22/21     Objective  Vitals: BP 104/62   Pulse (!) 49   Temp 98.1 F (36.7 C)   Ht 5' 2 (1.575 m)   Wt 140 lb 9.6 oz (63.8 kg)   SpO2 97%   BMI 25.72 kg/m  General:  Well developed, well nourished, no acute distress  Psych:  Alert and orientedx3,normal mood and affect HEENT:  Normocephalic, atraumatic, non-icteric sclera,  supple neck without adenopathy, mass or thyromegaly Cardiovascular:  Normal S1, S2, RRR without gallop, rub or murmur Respiratory:  Good breath sounds bilaterally, CTAB with normal respiratory effort Gastrointestinal: normal bowel sounds, soft, non-tender, no noted masses. No HSM MSK: extremities without edema, joints without erythema or swelling Neurologic:    Mental status is normal.  Gross motor and sensory exams are normal.  No tremor  Commons side effects, risks, benefits, and alternatives for medications and treatment plan prescribed today were discussed, and the patient expressed understanding of the given instructions. Patient is instructed to call or message via MyChart if he/she has any questions or concerns regarding our treatment plan. No barriers to understanding were identified. We discussed Red Flag symptoms and signs in detail. Patient expressed understanding regarding what to do in case of urgent or emergency type symptoms.  Medication list was reconciled, printed and provided to the patient in AVS. Patient instructions and summary information was reviewed with the patient as documented in the AVS. This note was prepared with assistance of Dragon voice recognition software. Occasional wrong-word or sound-a-like  substitutions may have occurred due to the inherent limitations of voice recognition software

## 2024-08-03 NOTE — Patient Instructions (Signed)
 Please return in 12 months for your annual complete physical; please come fasting.   I will release your lab results to you on your MyChart account with further instructions. You may see the results before I do, but when I review them I will send you a message with my report or have my assistant call you if things need to be discussed. Please reply to my message with any questions. Thank you!   If you have any questions or concerns, please don't hesitate to send me a message via MyChart or call the office at [phone]. Thank you for visiting with us  today! It's our pleasure caring for you.   Please do these things to maintain good health!  Exercise at least 30-45 minutes a day,  4-5 days a week.  Eat a low-fat diet with lots of fruits and vegetables, up to 7-9 servings per day. Drink plenty of water daily. Try to drink 8 8oz glasses per day. Seatbelts can save your life. Always wear your seatbelt. Place Smoke Detectors on every level of your home and check batteries every year. Schedule an appointment with an eye doctor for an eye exam every 1-2 years Safe sex - use condoms to protect yourself from STDs if you could be exposed to these types of infections. Use birth control if you do not want to become pregnant and are sexually active. Avoid heavy alcohol use. If you drink, keep it to less than 2 drinks/day and not every day. Health Care Power of Attorney.  Choose someone you trust that could speak for you if you became unable to speak for yourself. Depression is common in our stressful world.If you're feeling down or losing interest in things you normally enjoy, please come in for a visit. If anyone is threatening or hurting you, please get help. Physical or Emotional Violence is never OK.

## 2024-08-04 ENCOUNTER — Ambulatory Visit: Payer: Self-pay | Admitting: Family Medicine

## 2024-08-04 NOTE — Progress Notes (Signed)
 Labs reviewed.  The 10-year ASCVD risk score (Arnett DK, et al., 2019) is: 0.6%   Values used to calculate the score:     Age: 53 years     Clincally relevant sex: Female     Is Non-Hispanic African American: No     Diabetic: No     Tobacco smoker: No     Systolic Blood Pressure: 104 mmHg     Is BP treated: No     HDL Cholesterol: 59.7 mg/dL     Total Cholesterol: 159 mg/dL

## 2024-08-21 ENCOUNTER — Encounter: Payer: Self-pay | Admitting: Family Medicine

## 2024-08-21 ENCOUNTER — Other Ambulatory Visit: Payer: Self-pay | Admitting: Family Medicine

## 2024-08-21 DIAGNOSIS — Z1231 Encounter for screening mammogram for malignant neoplasm of breast: Secondary | ICD-10-CM

## 2024-08-21 DIAGNOSIS — G43009 Migraine without aura, not intractable, without status migrainosus: Secondary | ICD-10-CM

## 2024-08-21 DIAGNOSIS — N6489 Other specified disorders of breast: Secondary | ICD-10-CM

## 2024-08-21 DIAGNOSIS — Z0000 Encounter for general adult medical examination without abnormal findings: Secondary | ICD-10-CM

## 2024-08-21 DIAGNOSIS — J301 Allergic rhinitis due to pollen: Secondary | ICD-10-CM

## 2024-08-21 DIAGNOSIS — Z23 Encounter for immunization: Secondary | ICD-10-CM

## 2024-08-21 DIAGNOSIS — F339 Major depressive disorder, recurrent, unspecified: Secondary | ICD-10-CM

## 2024-08-24 ENCOUNTER — Emergency Department (HOSPITAL_COMMUNITY)

## 2024-08-24 ENCOUNTER — Emergency Department (HOSPITAL_COMMUNITY)
Admission: EM | Admit: 2024-08-24 | Discharge: 2024-08-25 | Disposition: A | Attending: Emergency Medicine | Admitting: Emergency Medicine

## 2024-08-24 DIAGNOSIS — R200 Anesthesia of skin: Secondary | ICD-10-CM | POA: Insufficient documentation

## 2024-08-24 DIAGNOSIS — R001 Bradycardia, unspecified: Secondary | ICD-10-CM | POA: Diagnosis not present

## 2024-08-24 DIAGNOSIS — R29810 Facial weakness: Secondary | ICD-10-CM | POA: Insufficient documentation

## 2024-08-24 DIAGNOSIS — R29818 Other symptoms and signs involving the nervous system: Secondary | ICD-10-CM | POA: Diagnosis not present

## 2024-08-24 DIAGNOSIS — R519 Headache, unspecified: Secondary | ICD-10-CM | POA: Diagnosis not present

## 2024-08-24 DIAGNOSIS — I4430 Unspecified atrioventricular block: Secondary | ICD-10-CM | POA: Diagnosis not present

## 2024-08-24 DIAGNOSIS — R202 Paresthesia of skin: Secondary | ICD-10-CM | POA: Diagnosis not present

## 2024-08-24 DIAGNOSIS — G510 Bell's palsy: Secondary | ICD-10-CM | POA: Diagnosis not present

## 2024-08-24 LAB — DIFFERENTIAL
Abs Immature Granulocytes: 0.01 K/uL (ref 0.00–0.07)
Basophils Absolute: 0.1 K/uL (ref 0.0–0.1)
Basophils Relative: 1 %
Eosinophils Absolute: 0.3 K/uL (ref 0.0–0.5)
Eosinophils Relative: 3 %
Immature Granulocytes: 0 %
Lymphocytes Relative: 42 %
Lymphs Abs: 3.4 K/uL (ref 0.7–4.0)
Monocytes Absolute: 0.7 K/uL (ref 0.1–1.0)
Monocytes Relative: 8 %
Neutro Abs: 3.7 K/uL (ref 1.7–7.7)
Neutrophils Relative %: 46 %

## 2024-08-24 LAB — I-STAT CHEM 8, ED
BUN: 20 mg/dL (ref 6–20)
Calcium, Ion: 1.12 mmol/L — ABNORMAL LOW (ref 1.15–1.40)
Chloride: 100 mmol/L (ref 98–111)
Creatinine, Ser: 1 mg/dL (ref 0.44–1.00)
Glucose, Bld: 104 mg/dL — ABNORMAL HIGH (ref 70–99)
HCT: 39 % (ref 36.0–46.0)
Hemoglobin: 13.3 g/dL (ref 12.0–15.0)
Potassium: 3.7 mmol/L (ref 3.5–5.1)
Sodium: 138 mmol/L (ref 135–145)
TCO2: 27 mmol/L (ref 22–32)

## 2024-08-24 LAB — APTT: aPTT: 29 s (ref 24–36)

## 2024-08-24 LAB — CBC
HCT: 38.8 % (ref 36.0–46.0)
Hemoglobin: 13.3 g/dL (ref 12.0–15.0)
MCH: 31.7 pg (ref 26.0–34.0)
MCHC: 34.3 g/dL (ref 30.0–36.0)
MCV: 92.6 fL (ref 80.0–100.0)
Platelets: 220 K/uL (ref 150–400)
RBC: 4.19 MIL/uL (ref 3.87–5.11)
RDW: 12.1 % (ref 11.5–15.5)
WBC: 8 K/uL (ref 4.0–10.5)
nRBC: 0 % (ref 0.0–0.2)

## 2024-08-24 LAB — PROTIME-INR
INR: 1.1 (ref 0.8–1.2)
Prothrombin Time: 15.2 s (ref 11.4–15.2)

## 2024-08-24 LAB — CBG MONITORING, ED: Glucose-Capillary: 102 mg/dL — ABNORMAL HIGH (ref 70–99)

## 2024-08-24 MED ORDER — SODIUM CHLORIDE 0.9% FLUSH
3.0000 mL | Freq: Once | INTRAVENOUS | Status: AC
  Administered 2024-08-25: 3 mL via INTRAVENOUS

## 2024-08-24 NOTE — ED Provider Notes (Signed)
 [facility] AT [facility] Provider Note   CSN: [id number] Arrival date & time: 08/24/24  2327  An emergency department physician performed an initial assessment on this suspected stroke patient at 2329.  Patient presents with: Code Stroke   Tiffany Knox is a 53 y.o. female.   53 year old female without significant past medical history presents ER today for facial droop.  Patient states that she was put on Chapstick and she noticed that the right side of her face was drooped.  Denies any eyelid involvement does endorse some pain behind her right ear.  No history of the same.  No recent illnesses.  No motor affected beside her arms.  EMS was called.  Code stroke activated and route (they did call in and based on their description I advised: Code stroke without eyelid or eye involvement).  Also feels a bit numb in that same area.        Prior to Admission medications   Medication Sig Start Date End Date Taking? Authorizing Provider  albuterol  (VENTOLIN  HFA) 108 (90 Base) MCG/ACT inhaler Inhale 2 puffs into the lungs every 4 (four) hours as needed for wheezing or shortness of breath. 08/11/22   Stuart Vernell Norris, PA-C  cetirizine  (ZYRTEC  ALLERGY) 10 MG tablet Take 1 tablet (10 mg total) by mouth daily. 08/11/22   Stuart Vernell Norris, PA-C  escitalopram  (LEXAPRO ) 10 MG tablet TAKE 1 TABLET BY MOUTH EVERY DAY 06/19/24   Jodie Lavern CROME, MD  hydroxypropyl methylcellulose / hypromellose (ISOPTO TEARS / GONIOVISC) 2.5 % ophthalmic solution Place 2 drops into the right eye every hour while awake. Every 1-2 hours 08/25/24   Boe Deans, Selinda, MD  NURTEC 75 MG TBDP TAKE 1 TABLET (75 MG TOTAL) BY MOUTH ONCE AS NEEDED. AT ONSET OF MIGRAINE 08/25/24   Jodie Lavern CROME, MD  predniSONE  (DELTASONE ) 20 MG tablet 3 tabs po daily x 3 days, then 2 tabs x 3 days, then 1.5 tabs x 3 days, then 1 tab x 3 days, then 0.5 tabs x 3 days 08/25/24   Alexsandria Kivett, Selinda, MD  White  Petrolatum-Mineral Oil (GENTEAL TEARS NIGHT-TIME) OINT Apply 1 strip to eye at bedtime for 14 days. 08/25/24 09/08/24  Tuff Clabo, Selinda, MD    Allergies: Patient has no known allergies.    Review of Systems  Updated Vital Signs BP 104/64   Pulse (!) 43   Temp 97.7 F (36.5 C) (Oral)   Resp 15   Ht 5' 2 (1.575 m)   Wt 70.8 kg   LMP 08/04/2024 (Approximate)   SpO2 100%   BMI 28.55 kg/m   Physical Exam Vitals and nursing note reviewed.  Constitutional:      Appearance: She is well-developed.  HENT:     Head: Normocephalic and atraumatic.  Eyes:     Extraocular Movements: Extraocular movements intact.  Cardiovascular:     Rate and Rhythm: Normal rate and regular rhythm.  Pulmonary:     Effort: No respiratory distress.     Breath sounds: No stridor.  Abdominal:     General: There is no distension.  Musculoskeletal:     Cervical back: Normal range of motion.  Neurological:     Mental Status: She is alert.     Cranial Nerves: Cranial nerve deficit (mild right facial droop below the maxilla) present.     (all labs ordered are listed, but only abnormal results are displayed) Labs Reviewed  COMPREHENSIVE METABOLIC PANEL WITH GFR - Abnormal; Notable for the  following components:      Result Value   Glucose, Bld 108 (*)    All other components within normal limits  I-STAT CHEM 8, ED - Abnormal; Notable for the following components:   Glucose, Bld 104 (*)    Calcium, Ion 1.12 (*)    All other components within normal limits  CBG MONITORING, ED - Abnormal; Notable for the following components:   Glucose-Capillary 102 (*)    All other components within normal limits  PROTIME-INR  APTT  CBC  DIFFERENTIAL  ETHANOL    EKG: None  Radiology: MR BRAIN WO CONTRAST Result Date: 08/25/2024 EXAM: MRI Brain Without Contrast 08/25/2024 12:12:17 AM TECHNIQUE: Multiplanar multisequence MRI of the head/brain was performed without the administration of intravenous contrast.  COMPARISON: None available. CLINICAL HISTORY: Headache, neuro deficit Headache, neuro deficit FINDINGS: BRAIN AND VENTRICLES: No acute infarct. No intracranial hemorrhage. No mass. No midline shift. No hydrocephalus. The sella is unremarkable. Normal flow voids. ORBITS: No acute abnormality. SINUSES AND MASTOIDS: No acute abnormality. BONES AND SOFT TISSUES: Normal marrow signal. No acute soft tissue abnormality. IMPRESSION: 1. No acute intracranial abnormality. Normal brain. Electronically signed by: Franky Stanford MD 08/25/2024 12:39 AM EST RP Workstation: [id number]   CT HEAD CODE STROKE WO CONTRAST Result Date: 08/24/2024 EXAM: CT HEAD WITHOUT 08/24/2024 11:37:41 PM TECHNIQUE: CT of the head was performed without the administration of intravenous contrast. Automated exposure control, iterative reconstruction, and/or weight based adjustment of the mA/kV was utilized to reduce the radiation dose to as low as reasonably achievable. COMPARISON: None available. CLINICAL HISTORY: Neuro deficit, acute, stroke suspected. FINDINGS: BRAIN AND VENTRICLES: No acute intracranial hemorrhage. No mass effect or midline shift. No extra-axial fluid collection. No evidence of acute infarct. No hydrocephalus. [place] Stroke Program Early CT Score (ASPECTS): Ganglionic (caudate, IC, lentiform nucleus, insula, M1-M3): 7 Supraganglionic (M4-M6): 3 Total: 10 ORBITS: No acute abnormality. SINUSES AND MASTOIDS: No acute abnormality. SOFT TISSUES AND SKULL: No acute skull fracture. No acute soft tissue abnormality. IMPRESSION: 1. No acute intracranial abnormality. 2. ASPECTS: 10. Findings communicated to Dr. Camellia Shark at 11:45 pm on 08/24/2024 Electronically signed by: Franky Stanford MD 08/24/2024 11:46 PM EST RP Workstation: [id number]     Procedures   Medications Ordered in the ED  sodium chloride  flush (NS) 0.9 % injection 3 mL (3 mLs Intravenous Given 08/25/24 0139)  predniSONE  (DELTASONE ) tablet 60 mg (60 mg Oral Given  08/25/24 0139)                                    Medical Decision Making Amount and/or Complexity of Data Reviewed Labs: ordered. Radiology: ordered.  Risk OTC drugs. Prescription drug management.  Could be bells without other neuro deficits, however will proceed with imaging.  MRI negative. On reevaluation patient is developing some right eyelid symptoms.  She is able to close against force and open it up and raise her eyebrows however when she blinks the right eye does not blink fully.  Suspect maybe this is a progression of Bell's palsy especially with the negative imaging low risk factors.  Will start prednisone  and have her follow-up with neurology ophthalmology.  Discussed with her making sure eyes were lubed and making sure that she keeps her eyes closed at night and possibly have to use plastic tape to keep them closed if it does not close fully and the risk of I damage or vision loss if  not noticed.      Final diagnoses:  Right facial numbness  Facial droop    ED Discharge Orders          Ordered    predniSONE  (DELTASONE ) 20 MG tablet  Status:  Discontinued        08/25/24 0127    White Petrolatum-Mineral Oil (GENTEAL TEARS NIGHT-TIME) OINT  Daily at bedtime,   Status:  Discontinued        08/25/24 0127    hydroxypropyl methylcellulose / hypromellose (ISOPTO TEARS / GONIOVISC) 2.5 % ophthalmic solution  Every hour while awake,   Status:  Discontinued        08/25/24 0127    Ambulatory referral to Neurology       Comments: An appointment is requested in approximately: 2 weeks   08/25/24 0128    hydroxypropyl methylcellulose / hypromellose (ISOPTO TEARS / GONIOVISC) 2.5 % ophthalmic solution  Every hour while awake        08/25/24 0131    predniSONE  (DELTASONE ) 20 MG tablet        08/25/24 0131    White Petrolatum-Mineral Oil (GENTEAL TEARS NIGHT-TIME) OINT  Daily at bedtime        08/25/24 0131               Graylen Noboa, Selinda, MD 08/25/24 2327

## 2024-08-24 NOTE — ED Triage Notes (Signed)
 Pt BIB [facility] from home c/o right sided facial droop. LKW 2130. Onset of symptoms 2145. EMS reports right side facial droop and numbness. Patient reports pain to the right side of her head throbbing 2/10. LVO 1.   EMS  112/60  Sinus brady 53  105 CBG   18 L AC

## 2024-08-24 NOTE — Consult Note (Signed)
 NEUROLOGY CONSULT NOTE   Date of service: August 24, 2024 Patient Name: Tiffany Knox MRN:  [id number] DOB:  08-03-1971 Chief Complaint: Right mastoid discomfort and right sided lip and cheek numbness Requesting Provider: Lorette Mayo, MD  History of Present Illness  Tiffany Knox is a 53 y.o. female with a PMHx of allergy, depression, heart murmur and migraines who presents to the ED as a Code Stroke via EMS after she experienced acute onset of right sided lip and cheek numbness, which was noticed while she was applying Chapstick to her lips at home. She states that the numbness extends contiguously from her lips to the adjacent skin of her right cheek and cheekbone. She also felt that there was some drooping to the right side of her mouth. At the time of symptom onset, she also noticed mild right mastoid throbbing discomfort that she rated as 2/10. No other neurological symptoms including no limb weakness, limb numbness, vision change, confusion, aphasia or ataxia. Vitals per EMS: BP 112/60; HR 53, CBG 105.    LKW: 2100 TOSR: 2145 Modified rankin score: 0 IV Thrombolysis/EVT: No: Symptoms too mild to treat.    NIHSS components Score: Comment  1a Level of Conscious 0[x]  1[]  2[]  3[]      1b LOC Questions 0[x]  1[]  2[]       1c LOC Commands 0[x]  1[]  2[]       2 Best Gaze 0[x]  1[]  2[]       3 Visual 0[x]  1[]  2[]  3[]      4 Facial Palsy 0[x]  1[]  2[]  3[]     Subtle asymmetry at rest, but movements of face when smiling, speaking and puckering is with equal contractions bilaterally.   5a Motor Arm - left 0[x]  1[]  2[]  3[]  4[]  UN[]    5b Motor Arm - Right 0[x]  1[]  2[]  3[]  4[]  UN[]    6a Motor Leg - Left 0[x]  1[]  2[]  3[]  4[]  UN[]    6b Motor Leg - Right 0[x]  1[]  2[]  3[]  4[]  UN[]    7 Limb Ataxia 0[x]  1[]  2[]  UN[]      8 Sensory 0[]  1[x]  2[]  UN[]     Numbness to FT, upper and lower lips on right not crossing the midline, contiguous with FT numbness to skin of cheek adjacent to lips extending to cheekbone  region. Spares remainder of the face. Temp sensation intact in the affected regions.   9 Best Language 0[x]  1[]  2[]  3[]      10 Dysarthria 0[x]  1[]  2[]  UN[]      11 Extinct. and Inattention 0[x]  1[]  2[]       TOTAL:   1      ROS  Comprehensive ROS performed and pertinent positives documented in HPI    Past History   Past Medical History:  Diagnosis Date   Allergy    Depression    Heart murmur    Major depression, recurrent, chronic 06/15/2018   Migraines      No past surgical history on file.  Family History: Family History  Problem Relation Age of Onset   Bladder Cancer Mother    Stroke Mother    Miscarriages / Stillbirths Mother    COPD Father    Hearing loss Father    Hypertension Father    Asthma Sister    Healthy Daughter    Breast cancer Paternal Grandmother    Arthritis Paternal Grandmother    Asthma Paternal Grandmother    Hyperlipidemia Maternal Grandmother    Hypertension Maternal Grandmother    Lung cancer Maternal Grandfather  Kidney disease Maternal Grandfather    Early death Paternal Grandfather    Heart attack Paternal Grandfather    Asthma Sister     Social History  reports that she quit smoking about 21 years ago. Her smoking use included cigarettes. She started smoking about 36 years ago. She has a 15 pack-year smoking history. She has never used smokeless tobacco. She reports current alcohol use. She reports that she does not use drugs.  No Known Allergies  Medications   Current Facility-Administered Medications:    sodium chloride  flush (NS) 0.9 % injection 3 mL, 3 mL, Intravenous, Once, Mesner, Selinda, MD  Current Outpatient Medications:    albuterol  (VENTOLIN  HFA) 108 (90 Base) MCG/ACT inhaler, Inhale 2 puffs into the lungs every 4 (four) hours as needed for wheezing or shortness of breath., Disp: 18 g, Rfl: 0   cetirizine  (ZYRTEC  ALLERGY) 10 MG tablet, Take 1 tablet (10 mg total) by mouth daily., Disp: 30 tablet, Rfl: 2   escitalopram   (LEXAPRO ) 10 MG tablet, TAKE 1 TABLET BY MOUTH EVERY DAY, Disp: 90 tablet, Rfl: 3   Rimegepant Sulfate (NURTEC) 75 MG TBDP, Take 1 tablet (75 mg total) by mouth once as needed. At onset of migraine, Disp: 6 tablet, Rfl: 3  Vitals   Vitals:   08-25-24 2300  Weight: 70.8 kg  Height: 5' 2 (1.575 m)    Body mass index is 28.55 kg/m.   Physical Exam   Constitutional: Appears well-developed and well-nourished.  Psych: Anxious affect   Eyes: No scleral injection.  HENT: No OP obstruction.  Head: Normocephalic.  Respiratory: Effort normal, non-labored breathing.  Skin: WDI.   Neurologic Examination   See NIHSS  Labs/Imaging/Neurodiagnostic studies   CBC:  Recent Labs  Lab 25-Aug-2024 2328 Aug 25, 2024 2333  WBC 8.0  --   NEUTROABS 3.7  --   HGB 13.3 13.3  HCT 38.8 39.0  MCV 92.6  --   PLT 220  --    Basic Metabolic Panel:  Lab Results  Component Value Date   NA 138 08/25/24   K 3.7 08/25/2024   CO2 30 08/03/2024   GLUCOSE 104 (H) August 25, 2024   BUN 20 Aug 25, 2024   CREATININE 1.00 August 25, 2024   CALCIUM 9.0 08/03/2024   Lipid Panel:  Lab Results  Component Value Date   LDLCALC 86 08/03/2024   HgbA1c: No results found for: HGBA1C Urine Drug Screen: No results found for: LABOPIA, COCAINSCRNUR, LABBENZ, AMPHETMU, THCU, LABBARB  Alcohol Level No results found for: [facility] INR  Lab Results  Component Value Date   INR 1.1 2024/08/25   APTT  Lab Results  Component Value Date   APTT 29 08-25-24     ASSESSMENT  53 y.o. female with a PMHx of allergy, depression, heart murmur and migraines who presents to the ED as a Code Stroke via EMS after she experienced acute onset of right sided lip and cheek numbness, which was noticed while she was applying Chapstick to her lips at home. She states that the numbness extends contiguously from her lips to the adjacent skin of her right cheek and cheekbone. She also felt that there was some drooping to the right side  of her mouth. At the time of symptom onset, she also noticed mild right mastoid throbbing discomfort that she rated as 2/10. - Exam reveals subtle facial asymmetry but with normal facial contractions. There is numbness to FT of her lips on the right, extending along her cheek to her cheekbone region. NIHSS 1.  -  CT head: No acute intracranial abnormality. ASPECTS: 10. - Ionized Ca is low. Labs otherwise unremarkable - Impression: DDx for her presentation includes focal neuropathic symptoms within a right trigeminal nerve branch versus complicated migraine versus stress-related symptoms. Stroke is felt to be unlikely.   RECOMMENDATIONS  - MRI brain - Further recommendations following MRI  Addendum: - MRI brain: No acute intracranial abnormality. Normal brain.  - The patient was noted to start having some eyelid involvement after MRI, which was really only noticeable when she blinked. Agree with EDP plan to treat for Bell's palsy with outpatient neurology follow up  ______________________________________________________________________    Bonney SHARK, Dakotah Heiman, MD [facility]

## 2024-08-25 ENCOUNTER — Other Ambulatory Visit: Payer: Self-pay

## 2024-08-25 ENCOUNTER — Other Ambulatory Visit: Payer: Self-pay | Admitting: Family Medicine

## 2024-08-25 ENCOUNTER — Encounter (HOSPITAL_COMMUNITY): Payer: Self-pay

## 2024-08-25 DIAGNOSIS — R519 Headache, unspecified: Secondary | ICD-10-CM | POA: Diagnosis not present

## 2024-08-25 DIAGNOSIS — R29818 Other symptoms and signs involving the nervous system: Secondary | ICD-10-CM | POA: Diagnosis not present

## 2024-08-25 LAB — COMPREHENSIVE METABOLIC PANEL WITH GFR
ALT: 38 U/L (ref 0–44)
AST: 34 U/L (ref 15–41)
Albumin: 3.7 g/dL (ref 3.5–5.0)
Alkaline Phosphatase: 45 U/L (ref 38–126)
Anion gap: 9 (ref 5–15)
BUN: 17 mg/dL (ref 6–20)
CO2: 27 mmol/L (ref 22–32)
Calcium: 9.1 mg/dL (ref 8.9–10.3)
Chloride: 101 mmol/L (ref 98–111)
Creatinine, Ser: 0.82 mg/dL (ref 0.44–1.00)
GFR, Estimated: >60 mL/min (ref >60)
Glucose, Bld: 108 mg/dL — ABNORMAL HIGH (ref 70–99)
Potassium: 3.7 mmol/L (ref 3.5–5.1)
Sodium: 137 mmol/L (ref 135–145)
Total Bilirubin: 0.8 mg/dL (ref 0.0–1.2)
Total Protein: 6.5 g/dL (ref 6.5–8.1)

## 2024-08-25 LAB — ETHANOL: Alcohol, Ethyl (B): <15 mg/dL (ref <15)

## 2024-08-25 MED ORDER — GENTEAL TEARS NIGHT-TIME OP OINT: 1.0000 | TOPICAL_OINTMENT | Freq: Every day | OPHTHALMIC | 1 refills | Status: DC

## 2024-08-25 MED ORDER — HYPROMELLOSE (GONIOSCOPIC) 2.5 % OP SOLN: 2.0000 [drp] | OPHTHALMIC | 1 refills | Status: DC

## 2024-08-25 MED ORDER — GENTEAL TEARS NIGHT-TIME OP OINT: 1.0000 | TOPICAL_OINTMENT | Freq: Every day | OPHTHALMIC | 1 refills | Status: AC

## 2024-08-25 MED ORDER — PREDNISONE 20 MG PO TABS: ORAL_TABLET | ORAL | 0 refills | Status: DC

## 2024-08-25 MED ORDER — PREDNISONE 20 MG PO TABS: ORAL_TABLET | ORAL | 0 refills | Status: AC

## 2024-08-25 MED ORDER — PREDNISONE 20 MG PO TABS
60.0000 mg | ORAL_TABLET | Freq: Once | ORAL | Status: AC
  Administered 2024-08-25: 60 mg via ORAL
  Filled 2024-08-25: qty 3

## 2024-08-25 MED ORDER — HYPROMELLOSE (GONIOSCOPIC) 2.5 % OP SOLN: 2.0000 [drp] | OPHTHALMIC | 1 refills | Status: AC

## 2024-08-25 NOTE — Code Documentation (Signed)
 Stroke Response Nurse Documentation Code Documentation  Tiffany Knox is a 53 y.o. female arriving to [facility]  via [facility] on 12/4 with past medical hx of migraines and depression. On No antithrombotic. Code stroke was activated by EMS.   Patient from home where she was LKW at 2145 and now complaining of right sided facial droop.   Stroke team at the bedside on patient arrival. Labs drawn and patient cleared for CT by Dr. Hunter. Patient to CT with team. NIHSS 1, see documentation for details and code stroke times. Patient with right facial droop on exam. The following imaging was completed:  CT Head and MRI. Patient is not a candidate for IV Thrombolytic due to too mild to treat. Patient is not a candidate for IR due to low suspicion of LVO.   Care Plan: Neuro checks q2 hrs.    Bedside handoff with ED RN Tiffany Knox.    Tiffany Knox ORN  Rapid Response RN

## 2024-08-28 ENCOUNTER — Telehealth: Payer: Self-pay

## 2024-08-28 ENCOUNTER — Ambulatory Visit: Payer: Self-pay

## 2024-08-28 NOTE — Telephone Encounter (Signed)
 Transition Care Management Follow-up Telephone Call Date of discharge and from where: 08/25/24 [facility] How have you been since you were released from the hospital? Worse but advised would get worse Any questions or concerns? No  Items Reviewed: Did the pt receive and understand the discharge instructions provided? Yes  Medications obtained and verified? Yes  Other? No  Any new allergies since your discharge? No  Dietary orders reviewed? No Do you have support at home? Yes  husband has been out of the country but coming home tonight.  Home Care and Equipment/Supplies: Were home health services ordered? not applicable If so, what is the name of the agency?   Has the agency set up a time to come to the patient's home? not applicable Were any new equipment or medical supplies ordered?  No What is the name of the medical supply agency?  Were you able to get the supplies/equipment? not applicable Do you have any questions related to the use of the equipment or supplies? No  Functional Questionnaire: (I = Independent and D = Dependent) ADLs: I  Bathing/Dressing- I  Meal Prep- I  Eating- I  Maintaining continence- I  Transferring/Ambulation- I  Managing Meds- I  Follow up appointments reviewed:  PCP Hospital f/u appt confirmed? Yes  Scheduled to see Lucie Buttner on 08/30/24  Specialist Hospital f/u appt confirmed? Yes  Scheduled to see Ophthalmology  on 08/31/24 Are transportation arrangements needed? No  If their condition worsens, is the pt aware to call PCP or go to the Emergency Dept.? Yes Was the patient provided with contact information for the PCP's office or ED? Yes Was to pt encouraged to call back with questions or concerns? Yes

## 2024-08-28 NOTE — Telephone Encounter (Signed)
 Pt was scheduled by E2C[facility] this morning; spoke with pt for [facility] call, please advise if you prefer pt to added to your schedule

## 2024-08-28 NOTE — Telephone Encounter (Signed)
 FYI Only or Action Required?: FYI only for provider: appointment scheduled on 08/30/24 added appointment to wait list for PCP as requested.  Patient was last seen in primary care on 08/03/2024 by Jodie Lavern CROME, MD.  Called Nurse Triage reporting Numbness.  Symptoms began several days ago.  Interventions attempted: Prescription medications: prednisone  and Rest, hydration, or home remedies.  Symptoms are: unchanged.  Triage Disposition: Go to ED Now (or PCP Triage)  Patient/caregiver understands and will follow disposition?: No, refuses disposition    Copied from CRM #[id number]. Topic: Clinical - Red Word Triage >> Aug 28, 2024 10:36 AM Willma R wrote: Red Word that prompted transfer to Nurse Triage: Patient was seen in the ED on 12/05 for partial numbness of the right side of her face. States since being discharge it has gotten worse. Wants to schedule a hospital follow up. Reason for Disposition  Bell's palsy suspected (i.e., weakness on only one side of the face, developing over hours to days, no other symptoms)    Diagnosed on 08/25/24 at ED, calling for hospital follow up appointment. Denies new symptoms.  Answer Assessment - Initial Assessment Questions Additional info: Calling today to scheduled hospital follow up, ER 08/25/24 for facial numbness that started on 08/24/24, dx Bell's Palsy. No new symptoms. PCP next available hospital follow up is not until January, scheduled hospital follow up with alternate provider on 08/30/24, added appointment to wait list for pcp or can she be worked into pcp schedule?     1. SYMPTOM: What is the main symptom you are concerned about? (e.g., weakness, numbness)     Continued right sided facial numbness more intense as she was told to expect, no new numbness, no new symptoms 2. ONSET: When did this start? (e.g., minutes, hours, days; while sleeping)     Thursday 08/24/24 3. LAST NORMAL: When was the last time you (the patient) were normal  (no symptoms)?     Thursday  4. PATTERN Does this come and go, or has it been constant since it started?  Is it present now?     Constant 5. CARDIAC SYMPTOMS: Have you had any of the following symptoms: chest pain, difficulty breathing, palpitations?     denies 6. NEUROLOGIC SYMPTOMS: Have you had any of the following symptoms: headache, dizziness, vision loss, double vision, changes in speech, unsteady on your feet?     Denies new symptoms 7. OTHER SYMPTOMS: Do you have any other symptoms?     Denies all other symptoms  8. PREGNANCY: Is there any chance you are pregnant? When was your last menstrual period?  Protocols used: Neurologic Deficit-A-AH

## 2024-08-29 ENCOUNTER — Telehealth: Payer: Self-pay | Admitting: Family Medicine

## 2024-08-29 VITALS — Ht 62.0 in | Wt 130.0 lb

## 2024-08-29 DIAGNOSIS — G510 Bell's palsy: Secondary | ICD-10-CM | POA: Insufficient documentation

## 2024-08-29 MED ORDER — VALACYCLOVIR HCL 1 G PO TABS: 1000.0000 mg | ORAL_TABLET | Freq: Three times a day (TID) | ORAL | 0 refills | Status: AC

## 2024-08-29 NOTE — Progress Notes (Signed)
 Subjective  CC:  Chief Complaint  Patient presents with   Hospitalization Follow-up    10/26/2023 - 08/25/2024 (2 hours) [place] [facility] at [facility]  Right facial numbness    HPI: Tiffany Knox is a 53 y.o. female who presents to the office today to address the problems listed above in the chief complaint. Discussed the use of AI scribe software for clinical note transcription with the patient, who gave verbal consent to proceed.  Virtual Visit via Video Note I connected with Frances E Piloto on 08/29/24 at 11:00 AM EST by a video enabled telemedicine application and verified that I am speaking with the correct person using two identifiers. Location patient: Home Location provider: [facility] Persons participating in the virtual visit: Katheryn FORBES Rouleau Lavern LITTIE Jodie, MD Avelina Berber, CMA  I discussed the limitations of evaluation and management by telemedicine and the availability of in person appointments. The patient expressed understanding and agreed to proceed.  History of Present Illness Tiffany Knox is a 53 year old female who presents with facial droop and weakness.  Facial weakness and droop/bells palsy I reviewed ED eval and studies; neg brain MRI.  - Onset approximately five days ago - Moderate to severe facial droop - Progression to include weakness in eyelid - Eye closes most of the way but becomes dry - Tapes eye shut at night to manage dryness, but has difficulty sleeping this way  Otalgia and auricular symptoms - Initial pain behind the ear, now resolved - No current pain or rash behind the ear - Chronic ringing in the ear, not a new symptom  Fatigue and sleep disturbance - Exhaustion since onset of symptoms - Sleeping extensively, including most of the day yesterday - Attempting to continue working despite fatigue  Current treatment - Taking prednisone , believes she has a two-week  supply   Assessment  1. Bell's palsy      Plan  Assessment and Plan Assessment & Plan Bell's palsy Moderate to severe Bell's palsy with facial droop and eyelid weakness, onset Thursday. No pain or rash. Persistent tinnitus. No antiviral treatment initiated in the emergency room. Prednisone  prescribed for two weeks. Variable progression and duration of symptoms, with potential for full recovery. Antivirals may be beneficial if herpes virus involvement is suspected. Emphasis on eye care to prevent corneal damage due to incomplete blinking. - Prescribed Valtrex  for one week. - Continue prednisone  for two weeks. - Advised on eye care: use lacro lube when available, consider weighted eye mask for comfort. - Encouraged rest, hydration, and intake of zinc and vitamin C. - Instructed to call if any issues arise.    Follow up: prn No orders of the defined types were placed in this encounter.  Meds ordered this encounter  Medications   valACYclovir  (VALTREX ) 1000 MG tablet    Sig: Take 1 tablet (1,000 mg total) by mouth 3 (three) times daily.    Dispense:  21 tablet    Refill:  0     I reviewed the patients updated PMH, FH, and SocHx.  Patient Active Problem List   Diagnosis Date Noted   Seasonal allergic rhinitis due to pollen 09/01/2022   Exercise-induced asthma 09/01/2022   Spondylosis of lumbar region without myelopathy or radiculopathy 06/23/2021   Migraine without aura, not intractable, without status migrainosus 06/15/2018   Major depression, recurrent, chronic 06/15/2018   Current Meds  Medication Sig   albuterol  (VENTOLIN  HFA) 108 (  90 Base) MCG/ACT inhaler Inhale 2 puffs into the lungs every 4 (four) hours as needed for wheezing or shortness of breath.   cetirizine  (ZYRTEC  ALLERGY) 10 MG tablet Take 1 tablet (10 mg total) by mouth daily. (Patient taking differently: Take 10 mg by mouth daily. PRN)   escitalopram  (LEXAPRO ) 10 MG tablet TAKE 1 TABLET BY MOUTH EVERY DAY    hydroxypropyl methylcellulose / hypromellose (ISOPTO TEARS / GONIOVISC) 2.5 % ophthalmic solution Place 2 drops into the right eye every hour while awake. Every 1-2 hours   NURTEC 75 MG TBDP TAKE 1 TABLET (75 MG TOTAL) BY MOUTH ONCE AS NEEDED. AT ONSET OF MIGRAINE   predniSONE  (DELTASONE ) 20 MG tablet 3 tabs po daily x 3 days, then 2 tabs x 3 days, then 1.5 tabs x 3 days, then 1 tab x 3 days, then 0.5 tabs x 3 days   valACYclovir  (VALTREX ) 1000 MG tablet Take 1 tablet (1,000 mg total) by mouth 3 (three) times daily.   White Petrolatum-Mineral Oil (GENTEAL TEARS NIGHT-TIME) OINT Apply 1 strip to eye at bedtime for 14 days.   Allergies: Patient has no known allergies. Family History: Patient family history includes Arthritis in her paternal grandmother; Asthma in her paternal grandmother, sister, and sister; Bladder Cancer in her mother; Breast cancer in her paternal grandmother; COPD in her father; Early death in her paternal grandfather; Healthy in her daughter; Hearing loss in her father; Heart attack in her paternal grandfather; Hyperlipidemia in her maternal grandmother; Hypertension in her father and maternal grandmother; Kidney disease in her maternal grandfather; Lung cancer in her maternal grandfather; Miscarriages / Stillbirths in her mother; Stroke in her mother. Social History:  Patient  reports that she quit smoking about 21 years ago. Her smoking use included cigarettes. She started smoking about 36 years ago. She has a 15 pack-year smoking history. She has never used smokeless tobacco. She reports current alcohol use. She reports that she does not use drugs.  Review of Systems: Constitutional: Negative for fever malaise or anorexia Cardiovascular: negative for chest pain Respiratory: negative for SOB or persistent cough Gastrointestinal: negative for abdominal pain  Objective  Vitals: Ht 5' 2 (1.575 m)   Wt 130 lb (59 kg)   LMP 08/04/2024 (Approximate)   BMI 23.78 kg/m   General: no acute distress , A&Ox3 HEENT: right prominent facial droop; decreased blinking on right, decreased forehead wrinkles with eyebrow elevation on right.   No visits with results within 1 Day(s) from this visit.  Latest known visit with results is:  Admission on 08/24/2024, Discharged on 08/25/2024  Component Date Value Ref Range Status   Prothrombin Time 08/24/2024 15.2  11.4 - 15.2 seconds Final   INR 08/24/2024 1.1  0.8 - 1.2 Final   aPTT 08/24/2024 29  24 - 36 seconds Final   WBC 08/24/2024 8.0  4.0 - 10.5 K/uL Final   RBC 08/24/2024 4.19  3.87 - 5.11 MIL/uL Final   Hemoglobin 08/24/2024 13.3  12.0 - 15.0 g/dL Final   HCT 87/95/7974 38.8  36.0 - 46.0 % Final   MCV 08/24/2024 92.6  80.0 - 100.0 fL Final   MCH 08/24/2024 31.7  26.0 - 34.0 pg Final   MCHC 08/24/2024 34.3  30.0 - 36.0 g/dL Final   RDW 87/95/7974 12.1  11.5 - 15.5 % Final   Platelets 08/24/2024 220  150 - 400 K/uL Final   nRBC 08/24/2024 0.0  0.0 - 0.2 % Final   Neutrophils Relative % 08/24/2024 46  %  Final   Neutro Abs 08/24/2024 3.7  1.7 - 7.7 K/uL Final   Lymphocytes Relative 08/24/2024 42  % Final   Lymphs Abs 08/24/2024 3.4  0.7 - 4.0 K/uL Final   Monocytes Relative 08/24/2024 8  % Final   Monocytes Absolute 08/24/2024 0.7  0.1 - 1.0 K/uL Final   Eosinophils Relative 08/24/2024 3  % Final   Eosinophils Absolute 08/24/2024 0.3  0.0 - 0.5 K/uL Final   Basophils Relative 08/24/2024 1  % Final   Basophils Absolute 08/24/2024 0.1  0.0 - 0.1 K/uL Final   Immature Granulocytes 08/24/2024 0  % Final   Abs Immature Granulocytes 08/24/2024 0.01  0.00 - 0.07 K/uL Final   Sodium 08/24/2024 137  135 - 145 mmol/L Final   Potassium 08/24/2024 3.7  3.5 - 5.1 mmol/L Final   Chloride 08/24/2024 101  98 - 111 mmol/L Final   CO2 08/24/2024 27  22 - 32 mmol/L Final   Glucose, Bld 08/24/2024 108 (H)  70 - 99 mg/dL Final   BUN 87/95/7974 17  6 - 20 mg/dL Final   Creatinine, Ser 08/24/2024 0.82  0.44 - 1.00 mg/dL Final    Calcium 87/95/7974 9.1  8.9 - 10.3 mg/dL Final   Total Protein 87/95/7974 6.5  6.5 - 8.1 g/dL Final   Albumin 87/95/7974 3.7  3.5 - 5.0 g/dL Final   AST 87/95/7974 34  15 - 41 U/L Final   ALT 08/24/2024 38  0 - 44 U/L Final   Alkaline Phosphatase 08/24/2024 45  38 - 126 U/L Final   Total Bilirubin 08/24/2024 0.8  0.0 - 1.2 mg/dL Final   GFR, Estimated 08/24/2024 >60  >60 mL/min Final   Anion gap 08/24/2024 9  5 - 15 Final   Alcohol, Ethyl (B) 08/24/2024 <15  <15 mg/dL Final   Sodium 87/95/7974 138  135 - 145 mmol/L Final   Potassium 08/24/2024 3.7  3.5 - 5.1 mmol/L Final   Chloride 08/24/2024 100  98 - 111 mmol/L Final   BUN 08/24/2024 20  6 - 20 mg/dL Final   Creatinine, Ser 08/24/2024 1.00  0.44 - 1.00 mg/dL Final   Glucose, Bld 87/95/7974 104 (H)  70 - 99 mg/dL Final   Calcium, Ion 87/95/7974 1.12 (L)  1.15 - 1.40 mmol/L Final   TCO2 08/24/2024 27  22 - 32 mmol/L Final   Hemoglobin 08/24/2024 13.3  12.0 - 15.0 g/dL Final   HCT 87/95/7974 39.0  36.0 - 46.0 % Final   Glucose-Capillary 08/24/2024 102 (H)  70 - 99 mg/dL Final    Brain MRI 87/95: IMPRESSION: 1. No acute intracranial abnormality. Normal brain. CT brain, code stroke w/o contrast: IMPRESSION: 1. No acute intracranial abnormality. Commons side effects, risks, benefits, and alternatives for medications and treatment plan prescribed today were discussed, and the patient expressed understanding of the given instructions. Patient is instructed to call or message via MyChart if he/she has any questions or concerns regarding our treatment plan. No barriers to understanding were identified. We discussed Red Flag symptoms and signs in detail. Patient expressed understanding regarding what to do in case of urgent or emergency type symptoms.  Medication list was reconciled, printed and provided to the patient in AVS. Patient instructions and summary information was reviewed with the patient as documented in the AVS. This note was  prepared with assistance of Dragon voice recognition software. Occasional wrong-word or sound-a-like substitutions may have occurred due to the inherent limitations of voice recognition software

## 2024-08-29 NOTE — Patient Instructions (Signed)
 Please follow up if symptoms do not improve or as needed.     VISIT SUMMARY: You came in today due to facial droop and weakness that started about five days ago. You also mentioned experiencing fatigue and sleep disturbances since the onset of your symptoms. You are currently taking prednisone  and managing eye dryness by taping your eye shut at night.  YOUR PLAN: -BELL'S PALSY: Bell's palsy is a condition that causes sudden weakness in the muscles on one side of the face. It can be caused by a viral infection. You have been prescribed Valtrex  for one week and should continue taking prednisone  for two weeks. For eye care, use lacro lube when available and consider using a weighted eye mask for comfort. Make sure to rest, stay hydrated, and take zinc and vitamin C. Please call if you experience any issues.  INSTRUCTIONS: Please follow up if you experience any new or worsening symptoms. Continue with your current medications and eye care routine as discussed.                      Contains text generated by Abridge.                                 Contains text generated by Abridge.

## 2024-08-30 ENCOUNTER — Inpatient Hospital Stay: Admitting: Physician Assistant

## 2024-08-31 DIAGNOSIS — G510 Bell's palsy: Secondary | ICD-10-CM | POA: Diagnosis not present

## 2024-09-04 ENCOUNTER — Encounter: Payer: Self-pay | Admitting: Family Medicine

## 2024-09-04 NOTE — Telephone Encounter (Signed)
 Please review and advise. Tks

## 2024-09-26 ENCOUNTER — Encounter

## 2024-10-12 ENCOUNTER — Telehealth: Payer: Self-pay | Admitting: Pharmacy Technician

## 2024-10-12 ENCOUNTER — Other Ambulatory Visit (HOSPITAL_COMMUNITY): Payer: Self-pay

## 2024-10-12 NOTE — Telephone Encounter (Signed)
 Pharmacy Patient Advocate Encounter   Received notification from [employer] that prior authorization for Nurtec 75MG  dispersible tablets is due for renewal.   Insurance verification completed.   The patient is insured through [employer] [place] .  Action: PA required; PA submitted to above mentioned insurance via Latent Key/confirmation #/EOC [id number] Status is pending

## 2024-10-12 NOTE — Telephone Encounter (Signed)
 Pharmacy Patient Advocate Encounter  Received notification from  [facility] [place]  that Prior Authorization for Nurtec 75MG  dispersible tablets has been APPROVED from 10/12/2024 to 10/12/2026.   PA #/Case ID/Reference #: [id number]

## 2024-10-13 ENCOUNTER — Ambulatory Visit
Admission: RE | Admit: 2024-10-13 | Discharge: 2024-10-13 | Disposition: A | Source: Ambulatory Visit | Attending: Family Medicine | Admitting: Family Medicine

## 2024-10-13 DIAGNOSIS — N6489 Other specified disorders of breast: Secondary | ICD-10-CM

## 2024-10-16 ENCOUNTER — Other Ambulatory Visit (HOSPITAL_COMMUNITY): Payer: Self-pay

## 2024-11-01 ENCOUNTER — Ambulatory Visit

## 2025-08-10 ENCOUNTER — Encounter: Admitting: Family Medicine
